# Patient Record
Sex: Female | Born: 1935 | Race: White | Hispanic: No | Smoking: Former smoker
Health system: Southern US, Community
[De-identification: ages and names within clinical notes are randomized; demographics above are authoritative.]

## PROBLEM LIST (undated history)

## (undated) DIAGNOSIS — I10 Essential (primary) hypertension: Secondary | ICD-10-CM

## (undated) DIAGNOSIS — J449 Chronic obstructive pulmonary disease, unspecified: Secondary | ICD-10-CM

## (undated) DIAGNOSIS — I350 Nonrheumatic aortic (valve) stenosis: Secondary | ICD-10-CM

## (undated) DIAGNOSIS — I472 Ventricular tachycardia, unspecified: Secondary | ICD-10-CM

## (undated) DIAGNOSIS — G459 Transient cerebral ischemic attack, unspecified: Secondary | ICD-10-CM

## (undated) DIAGNOSIS — K219 Gastro-esophageal reflux disease without esophagitis: Secondary | ICD-10-CM

## (undated) DIAGNOSIS — R42 Dizziness and giddiness: Secondary | ICD-10-CM

## (undated) DIAGNOSIS — I679 Cerebrovascular disease, unspecified: Secondary | ICD-10-CM

## (undated) DIAGNOSIS — C50919 Malignant neoplasm of unspecified site of unspecified female breast: Secondary | ICD-10-CM

## (undated) DIAGNOSIS — E785 Hyperlipidemia, unspecified: Secondary | ICD-10-CM

## (undated) DIAGNOSIS — I2510 Atherosclerotic heart disease of native coronary artery without angina pectoris: Secondary | ICD-10-CM

## (undated) DIAGNOSIS — M1990 Unspecified osteoarthritis, unspecified site: Secondary | ICD-10-CM

## (undated) DIAGNOSIS — I340 Nonrheumatic mitral (valve) insufficiency: Secondary | ICD-10-CM

## (undated) HISTORY — DX: Malignant neoplasm of unspecified site of unspecified female breast: C50.919

## (undated) HISTORY — DX: Cerebrovascular disease, unspecified: I67.9

## (undated) HISTORY — DX: Nonrheumatic aortic (valve) stenosis: I35.0

## (undated) HISTORY — PX: BREAST SURGERY: SHX581

## (undated) HISTORY — DX: Transient cerebral ischemic attack, unspecified: G45.9

## (undated) HISTORY — DX: Unspecified osteoarthritis, unspecified site: M19.90

## (undated) HISTORY — DX: Ventricular tachycardia: I47.2

## (undated) HISTORY — DX: Ventricular tachycardia, unspecified: I47.20

## (undated) HISTORY — PX: MASTECTOMY: SHX3

## (undated) HISTORY — DX: Essential (primary) hypertension: I10

## (undated) HISTORY — DX: Atherosclerotic heart disease of native coronary artery without angina pectoris: I25.10

## (undated) HISTORY — DX: Nonrheumatic mitral (valve) insufficiency: I34.0

## (undated) HISTORY — DX: Hyperlipidemia, unspecified: E78.5

## (undated) HISTORY — DX: Chronic obstructive pulmonary disease, unspecified: J44.9

## (undated) HISTORY — DX: Dizziness and giddiness: R42

## (undated) HISTORY — DX: Gastro-esophageal reflux disease without esophagitis: K21.9

## (undated) HISTORY — PX: TONSILLECTOMY: SHX5217

---

## 1968-06-02 HISTORY — PX: LOBECTOMY: SHX5089

## 1995-06-03 LAB — HM MAMMOGRAPHY

## 2004-06-02 HISTORY — PX: CARDIAC CATHETERIZATION: SHX172

## 2004-06-07 ENCOUNTER — Encounter: Payer: Self-pay | Admitting: Cardiology

## 2004-06-26 ENCOUNTER — Inpatient Hospital Stay (HOSPITAL_BASED_OUTPATIENT_CLINIC_OR_DEPARTMENT_OTHER): Admission: RE | Admit: 2004-06-26 | Discharge: 2004-06-26 | Payer: Self-pay | Admitting: Cardiovascular Disease

## 2004-06-26 ENCOUNTER — Encounter: Payer: Self-pay | Admitting: Cardiology

## 2005-09-23 ENCOUNTER — Encounter: Admission: RE | Admit: 2005-09-23 | Discharge: 2005-09-23 | Payer: Self-pay | Admitting: Internal Medicine

## 2005-09-24 ENCOUNTER — Encounter: Payer: Self-pay | Admitting: Cardiology

## 2007-06-16 ENCOUNTER — Encounter: Payer: Self-pay | Admitting: Cardiology

## 2008-06-02 HISTORY — PX: JOINT REPLACEMENT: SHX530

## 2008-07-11 ENCOUNTER — Inpatient Hospital Stay (HOSPITAL_COMMUNITY): Admission: RE | Admit: 2008-07-11 | Discharge: 2008-07-14 | Payer: Self-pay | Admitting: Orthopedic Surgery

## 2008-10-17 ENCOUNTER — Encounter: Payer: Self-pay | Admitting: Cardiology

## 2008-10-23 ENCOUNTER — Ambulatory Visit: Payer: Self-pay | Admitting: Internal Medicine

## 2008-10-23 DIAGNOSIS — I2510 Atherosclerotic heart disease of native coronary artery without angina pectoris: Secondary | ICD-10-CM | POA: Insufficient documentation

## 2008-10-23 DIAGNOSIS — R42 Dizziness and giddiness: Secondary | ICD-10-CM

## 2008-10-23 DIAGNOSIS — K219 Gastro-esophageal reflux disease without esophagitis: Secondary | ICD-10-CM

## 2008-10-23 DIAGNOSIS — E785 Hyperlipidemia, unspecified: Secondary | ICD-10-CM | POA: Insufficient documentation

## 2008-10-23 DIAGNOSIS — J4489 Other specified chronic obstructive pulmonary disease: Secondary | ICD-10-CM | POA: Insufficient documentation

## 2008-10-23 DIAGNOSIS — J449 Chronic obstructive pulmonary disease, unspecified: Secondary | ICD-10-CM | POA: Insufficient documentation

## 2008-10-23 DIAGNOSIS — Z853 Personal history of malignant neoplasm of breast: Secondary | ICD-10-CM

## 2008-10-23 DIAGNOSIS — I10 Essential (primary) hypertension: Secondary | ICD-10-CM | POA: Insufficient documentation

## 2008-11-16 ENCOUNTER — Ambulatory Visit: Payer: Self-pay | Admitting: Internal Medicine

## 2008-11-16 ENCOUNTER — Telehealth: Payer: Self-pay | Admitting: Internal Medicine

## 2008-11-16 LAB — CONVERTED CEMR LAB
ALT: 24 units/L (ref 0–35)
AST: 34 units/L (ref 0–37)
Albumin: 4.1 g/dL (ref 3.5–5.2)
Alkaline Phosphatase: 77 units/L (ref 39–117)
BUN: 20 mg/dL (ref 6–23)
Basophils Absolute: 0 10*3/uL (ref 0.0–0.1)
Basophils Relative: 0.7 % (ref 0.0–3.0)
Bilirubin, Direct: 0.2 mg/dL (ref 0.0–0.3)
CO2: 29 meq/L (ref 19–32)
Calcium: 9.7 mg/dL (ref 8.4–10.5)
Chloride: 95 meq/L — ABNORMAL LOW (ref 96–112)
Cholesterol: 168 mg/dL (ref 0–200)
Creatinine, Ser: 1 mg/dL (ref 0.4–1.2)
Eosinophils Absolute: 0.3 10*3/uL (ref 0.0–0.7)
Eosinophils Relative: 5.5 % — ABNORMAL HIGH (ref 0.0–5.0)
GFR calc non Af Amer: 56.62 mL/min (ref >60)
Glucose, Bld: 97 mg/dL (ref 70–99)
HCT: 41.6 % (ref 36.0–46.0)
HDL: 70.3 mg/dL (ref >39.00)
Hemoglobin: 14.5 g/dL (ref 12.0–15.0)
LDL Cholesterol: 87 mg/dL (ref 0–99)
Lymphocytes Relative: 29.8 % (ref 12.0–46.0)
Lymphs Abs: 1.5 10*3/uL (ref 0.7–4.0)
MCHC: 34.8 g/dL (ref 30.0–36.0)
MCV: 92.1 fL (ref 78.0–100.0)
Monocytes Absolute: 0.5 10*3/uL (ref 0.1–1.0)
Monocytes Relative: 10.2 % (ref 3.0–12.0)
Neutro Abs: 2.7 10*3/uL (ref 1.4–7.7)
Neutrophils Relative %: 53.8 % (ref 43.0–77.0)
Platelets: 179 10*3/uL (ref 150.0–400.0)
Potassium: 6 meq/L (ref 3.5–5.1)
RBC: 4.52 M/uL (ref 3.87–5.11)
RDW: 13.6 % (ref 11.5–14.6)
Sodium: 132 meq/L — ABNORMAL LOW (ref 135–145)
TSH: 1.37 microintl units/mL (ref 0.35–5.50)
Total Bilirubin: 1.2 mg/dL (ref 0.3–1.2)
Total CHOL/HDL Ratio: 2
Total Protein: 7.2 g/dL (ref 6.0–8.3)
Triglycerides: 54 mg/dL (ref 0.0–149.0)
VLDL: 10.8 mg/dL (ref 0.0–40.0)
WBC: 5 10*3/uL (ref 4.5–10.5)

## 2008-11-17 ENCOUNTER — Ambulatory Visit: Payer: Self-pay | Admitting: Internal Medicine

## 2008-11-17 ENCOUNTER — Telehealth: Payer: Self-pay | Admitting: Internal Medicine

## 2008-11-21 ENCOUNTER — Telehealth: Payer: Self-pay | Admitting: Internal Medicine

## 2008-11-21 DIAGNOSIS — G459 Transient cerebral ischemic attack, unspecified: Secondary | ICD-10-CM | POA: Insufficient documentation

## 2008-11-29 ENCOUNTER — Ambulatory Visit: Payer: Self-pay

## 2008-11-29 ENCOUNTER — Encounter: Payer: Self-pay | Admitting: Internal Medicine

## 2008-12-01 ENCOUNTER — Encounter: Admission: RE | Admit: 2008-12-01 | Discharge: 2008-12-01 | Payer: Self-pay | Admitting: Internal Medicine

## 2008-12-04 ENCOUNTER — Telehealth: Payer: Self-pay | Admitting: Internal Medicine

## 2008-12-04 DIAGNOSIS — E079 Disorder of thyroid, unspecified: Secondary | ICD-10-CM | POA: Insufficient documentation

## 2008-12-06 ENCOUNTER — Telehealth: Payer: Self-pay | Admitting: Internal Medicine

## 2008-12-06 ENCOUNTER — Ambulatory Visit: Payer: Self-pay | Admitting: Diagnostic Radiology

## 2008-12-06 ENCOUNTER — Ambulatory Visit (HOSPITAL_BASED_OUTPATIENT_CLINIC_OR_DEPARTMENT_OTHER): Admission: RE | Admit: 2008-12-06 | Discharge: 2008-12-06 | Payer: Self-pay | Admitting: Internal Medicine

## 2008-12-26 ENCOUNTER — Encounter: Payer: Self-pay | Admitting: Internal Medicine

## 2009-01-18 ENCOUNTER — Ambulatory Visit: Payer: Self-pay | Admitting: Internal Medicine

## 2009-01-22 ENCOUNTER — Telehealth: Payer: Self-pay | Admitting: Internal Medicine

## 2009-01-29 ENCOUNTER — Encounter: Payer: Self-pay | Admitting: Internal Medicine

## 2009-03-05 ENCOUNTER — Telehealth: Payer: Self-pay | Admitting: Internal Medicine

## 2009-03-14 ENCOUNTER — Ambulatory Visit: Payer: Self-pay | Admitting: Internal Medicine

## 2009-05-08 ENCOUNTER — Telehealth: Payer: Self-pay | Admitting: Internal Medicine

## 2009-07-09 ENCOUNTER — Ambulatory Visit: Payer: Self-pay | Admitting: Internal Medicine

## 2009-07-09 LAB — CONVERTED CEMR LAB
CO2: 28 meq/L (ref 19–32)
Glucose, Bld: 84 mg/dL (ref 70–99)

## 2009-07-19 ENCOUNTER — Ambulatory Visit: Payer: Self-pay | Admitting: Internal Medicine

## 2009-07-23 DIAGNOSIS — M1990 Unspecified osteoarthritis, unspecified site: Secondary | ICD-10-CM | POA: Insufficient documentation

## 2009-07-25 ENCOUNTER — Encounter: Payer: Self-pay | Admitting: Cardiology

## 2009-07-25 ENCOUNTER — Ambulatory Visit: Payer: Self-pay | Admitting: Cardiology

## 2009-07-25 DIAGNOSIS — I679 Cerebrovascular disease, unspecified: Secondary | ICD-10-CM

## 2009-07-25 DIAGNOSIS — I359 Nonrheumatic aortic valve disorder, unspecified: Secondary | ICD-10-CM | POA: Insufficient documentation

## 2009-07-29 ENCOUNTER — Emergency Department (HOSPITAL_BASED_OUTPATIENT_CLINIC_OR_DEPARTMENT_OTHER): Admission: EM | Admit: 2009-07-29 | Discharge: 2009-07-29 | Payer: Self-pay | Admitting: Emergency Medicine

## 2009-07-30 ENCOUNTER — Telehealth: Payer: Self-pay | Admitting: Cardiology

## 2009-08-01 ENCOUNTER — Ambulatory Visit: Payer: Self-pay | Admitting: Radiology

## 2009-08-01 ENCOUNTER — Ambulatory Visit (HOSPITAL_BASED_OUTPATIENT_CLINIC_OR_DEPARTMENT_OTHER): Admission: RE | Admit: 2009-08-01 | Discharge: 2009-08-01 | Payer: Self-pay | Admitting: Internal Medicine

## 2009-08-01 ENCOUNTER — Ambulatory Visit: Payer: Self-pay | Admitting: Internal Medicine

## 2009-08-09 ENCOUNTER — Telehealth (INDEPENDENT_AMBULATORY_CARE_PROVIDER_SITE_OTHER): Payer: Self-pay | Admitting: *Deleted

## 2009-08-13 ENCOUNTER — Ambulatory Visit: Payer: Self-pay

## 2009-08-13 ENCOUNTER — Ambulatory Visit: Payer: Self-pay | Admitting: Cardiology

## 2009-08-13 ENCOUNTER — Encounter (HOSPITAL_COMMUNITY): Admission: RE | Admit: 2009-08-13 | Discharge: 2009-10-02 | Payer: Self-pay | Admitting: Cardiology

## 2009-08-17 ENCOUNTER — Ambulatory Visit: Payer: Self-pay | Admitting: Internal Medicine

## 2009-08-21 ENCOUNTER — Telehealth (INDEPENDENT_AMBULATORY_CARE_PROVIDER_SITE_OTHER): Payer: Self-pay | Admitting: *Deleted

## 2009-08-30 ENCOUNTER — Telehealth: Payer: Self-pay | Admitting: Internal Medicine

## 2009-09-03 ENCOUNTER — Telehealth: Payer: Self-pay | Admitting: Internal Medicine

## 2009-11-01 ENCOUNTER — Ambulatory Visit: Payer: Self-pay | Admitting: Internal Medicine

## 2009-11-28 ENCOUNTER — Encounter: Payer: Self-pay | Admitting: Internal Medicine

## 2009-11-28 LAB — CONVERTED CEMR LAB
CO2: 24 meq/L (ref 19–32)
Calcium: 9.8 mg/dL (ref 8.4–10.5)
Creatinine, Ser: 0.96 mg/dL (ref 0.40–1.20)
LDL Cholesterol: 87 mg/dL (ref 0–99)
Potassium: 5.2 meq/L (ref 3.5–5.3)
Triglycerides: 78 mg/dL (ref <150)

## 2009-11-29 ENCOUNTER — Encounter: Payer: Self-pay | Admitting: Internal Medicine

## 2009-12-11 ENCOUNTER — Ambulatory Visit (HOSPITAL_BASED_OUTPATIENT_CLINIC_OR_DEPARTMENT_OTHER): Admission: RE | Admit: 2009-12-11 | Discharge: 2009-12-17 | Payer: Self-pay | Admitting: Surgical Oncology

## 2009-12-17 ENCOUNTER — Telehealth: Payer: Self-pay | Admitting: Internal Medicine

## 2009-12-17 ENCOUNTER — Ambulatory Visit: Payer: Self-pay | Admitting: Diagnostic Radiology

## 2010-01-11 ENCOUNTER — Telehealth: Payer: Self-pay | Admitting: Internal Medicine

## 2010-03-18 ENCOUNTER — Ambulatory Visit: Payer: Self-pay | Admitting: Internal Medicine

## 2010-04-04 ENCOUNTER — Encounter: Payer: Self-pay | Admitting: Internal Medicine

## 2010-04-04 ENCOUNTER — Ambulatory Visit: Payer: Self-pay | Admitting: Internal Medicine

## 2010-04-04 DIAGNOSIS — R0989 Other specified symptoms and signs involving the circulatory and respiratory systems: Secondary | ICD-10-CM

## 2010-04-04 DIAGNOSIS — R0609 Other forms of dyspnea: Secondary | ICD-10-CM | POA: Insufficient documentation

## 2010-04-04 DIAGNOSIS — J309 Allergic rhinitis, unspecified: Secondary | ICD-10-CM | POA: Insufficient documentation

## 2010-04-17 ENCOUNTER — Telehealth: Payer: Self-pay | Admitting: Internal Medicine

## 2010-05-10 ENCOUNTER — Ambulatory Visit: Payer: Self-pay | Admitting: Internal Medicine

## 2010-05-10 LAB — CONVERTED CEMR LAB
CO2: 26 meq/L (ref 19–32)
Glucose, Bld: 81 mg/dL (ref 70–99)
Hemoglobin: 14.4 g/dL (ref 12.0–15.0)
MCHC: 32.4 g/dL (ref 30.0–36.0)
Platelets: 191 10*3/uL (ref 150–400)
RBC: 4.49 M/uL (ref 3.87–5.11)
RDW: 14.3 % (ref 11.5–15.5)
Sodium: 138 meq/L (ref 135–145)

## 2010-05-12 ENCOUNTER — Encounter: Payer: Self-pay | Admitting: Internal Medicine

## 2010-06-24 ENCOUNTER — Encounter: Payer: Self-pay | Admitting: Internal Medicine

## 2010-06-30 LAB — CONVERTED CEMR LAB
BUN: 23 mg/dL (ref 6–23)
CO2: 29 meq/L (ref 19–32)
Calcium: 9.5 mg/dL (ref 8.4–10.5)
Chloride: 96 meq/L (ref 96–112)
Creatinine, Ser: 1 mg/dL (ref 0.4–1.2)
GFR calc non Af Amer: 56.62 mL/min (ref >60)

## 2010-07-02 NOTE — Assessment & Plan Note (Signed)
Summary: 10:15 appt., elevated B/P & not feeling well-jr   Vital Signs:  Patient profile:   77 year old female Weight:      133.25 pounds BMI:     23.69 O2 Sat:      99 % on Room air Temp:     97.3 degrees F oral Pulse rate:   74 / minute Pulse rhythm:   regular Resp:     22 per minute BP sitting:   140 / 70  (right arm) Cuff size:   regular  Vitals Entered By: Glendell Docker CMA (August 01, 2009 10:19 AM)  O2 Flow:  Room air CC: RM 3- elevated blood pressure   Primary Care Provider:  Dondra Spry DO  CC:  RM 3- elevated blood pressure.  History of Present Illness: 76 y/o white female for f/u.   We stopped HCTZ about 2 weeks ago due to relative low BP and fatigue.  her blood pressure has been elevated since then she has had an increase in dyspnea she denies chest pain  she was seen in [facility].   Bystolic dose increased  Allergies (verified): No Known Drug Allergies  Past History:  Past Medical History: OSTEOARTHRITIS (ICD-715.90) THYROID DISORDER (ICD-246.9) nodules TIA (ICD-435.9) INTERMITTENT VERTIGO (ICD-780.4) BREAST CANCER, HX OF (ICD-V10.3) COPD (ICD-496) GERD (ICD-530.81)  CORONARY ARTERY DISEASE (ICD-414.00) HYPERTENSION (ICD-401.9)  HYPERLIPIDEMIA (ICD-272.4) cardiac catheterization January/2006-single-vessel coronary artery disease with occlusion of the right current artery, mild to moderate mitral regurgitation with preserved left ventricular systolic function Dr. Elease Hashimoto ? ventricular tachycardia when she was 42   Past Surgical History: Mastectomy  1967, 1997 Total hip replacement 2010  Tonsillectomy  Lobectomy 1970      Family History: Father had cerebral hemorrhage age 73 CAD - mother        Social History: Retired  Widow/Widower son 34, 23  1 daughter 29   Tobacco Use - Former.  Alcohol Use - yes    Physical Exam  General:  alert, well-developed, and well-nourished.   Lungs:  normal respiratory effort, normal breath sounds, no  crackles, and no wheezes.   Heart:  normal rate, regular rhythm, and no gallop.   Extremities:  No lower extremity edema  Neurologic:  cranial nerves II-XII intact and gait normal.     Impression & Recommendations:  Problem # 1:  HYPERTENSION (ICD-401.9) BP higher since stopping HCTZ.  She was seen at [facility] over the weekend.   EKG and blood work reported normal.  No evidence of CHF.  I suspect dyspnea from diastolic dysfunction.   add amlodipine to lower BP.  continue higher dose of bystolic  Her updated medication list for this problem includes:    Losartan Potassium 100 Mg Tabs (Losartan potassium) .Marland Kitchen... 1 tab once a day    Bystolic 20 Mg Tabs (Nebivolol hcl) .Marland Kitchen... Take 1 tablet by mouth once a day    Amlodipine Besylate 5 Mg Tabs (Amlodipine besylate) ..... One by mouth once daily  BP today: 140/70 Prior BP: 150/70 (07/25/2009)  Labs Reviewed: K+: 4.9 (07/09/2009) Creat: : 0.97 (07/09/2009)   Chol: 168 (11/16/2008)   HDL: 70.30 (11/16/2008)   LDL: 87 (11/16/2008)   TG: 54.0 (11/16/2008)  Problem # 2:  COPD (ICD-496)  Her updated medication list for this problem includes:    Proair Hfa 108 (90 Base) Mcg/act Aers (Albuterol sulfate) .Marland Kitchen... 2 puff by mouth once daily as needed  Orders: T-2 View CXR, Same Day (71020.5TC)  Complete Medication  List: 1)  Zocor 40 Mg Tabs (Simvastatin) .... One tablet by mouth  every evening 2)  Ambien 10 Mg Tabs (Zolpidem tartrate) .... Take 1 tablet by mouth once a day as needed  as directed 3)  Omeprazole 20 Mg Tbec (Omeprazole) .... Take 1 tablet by mouth once a day as needed 4)  Proair Hfa 108 (90 Base) Mcg/act Aers (Albuterol sulfate) .... 2 puff by mouth once daily as needed 5)  Aspirin Low Dose 81 Mg Tabs (Aspirin) .... Take 1 tablet by mouth once a day 6)  Losartan Potassium 100 Mg Tabs (Losartan potassium) .Marland Kitchen.. 1 tab once a day 7)  Tramadol-acetaminophen 37.5-325 Mg Tabs (Tramadol-acetaminophen) .... Take 1 tablet by mouth once a day  as needed 8)  Ipratropium Bromide 0.06 % Soln (Ipratropium bromide) .... 2 two times a day 9)  Bystolic 20 Mg Tabs (Nebivolol hcl) .... Take 1 tablet by mouth once a day 10)  Amlodipine Besylate 5 Mg Tabs (Amlodipine besylate) .... One by mouth once daily  Patient Instructions: 1)  Please schedule a follow-up appointment in 2 weeks. Prescriptions: AMLODIPINE BESYLATE 5 MG TABS (AMLODIPINE BESYLATE) one by mouth once daily  #30 x 3   Entered and Authorized by:   D. Thomos Lemons DO   Signed by:   D. Thomos Lemons DO on 08/01/2009   Method used:   Electronically to        [facility] [phone]* (retail)       [address]       [place], [place]  [address]       Ph: [phone]       Fax: [phone]   RxID:   [id number]   Current Allergies (reviewed today): No known allergies

## 2010-07-02 NOTE — Assessment & Plan Note (Signed)
Summary: short of breath/mhf   Vital Signs:  Patient profile:   78 year old female Height:      63 inches Weight:      129.50 pounds BMI:     23.02 O2 Sat:      100 % on Room air Temp:     97.9 degrees F oral Pulse rate:   85 / minute Pulse rhythm:   regular Resp:     20 per minute BP sitting:   122 / 60  (right arm) Cuff size:   regular  Vitals Entered By: Glendell Docker CMA (April 04, 2010 11:37 AM)  O2 Flow:  Room air CC: Short of breath Is Patient Diabetic? No Pain Assessment Patient in pain? no      Comments refill of Tramdaol to [facility], increase of shortness of breath with activity for the past month   Primary Care Provider:  Dondra Spry DO  CC:  Short of breath.  History of Present Illness: 77 y/o white female with prev hx of tob use and CAD  reports more dyspnea x 1 month worse with colder temp some wheezing no significant cough  no chest pain  allergies / rhinitis also worse post nasal worse in AM  htn - stable  Preventive Screening-Counseling & Management  Alcohol-Tobacco     Smoking Status: quit  Allergies (verified): No Known Drug Allergies  Past History:  Past Medical History: OSTEOARTHRITIS (ICD-715.90) THYROID DISORDER (ICD-246.9) nodules TIA (ICD-435.9)  INTERMITTENT VERTIGO (ICD-780.4) BREAST CANCER, HX OF (ICD-V10.3)  COPD (ICD-496)  GERD (ICD-530.81)  CORONARY ARTERY DISEASE (ICD-414.00) HYPERTENSION (ICD-401.9)  HYPERLIPIDEMIA (ICD-272.4) cardiac catheterization January/2006-single-vessel coronary artery disease with occlusion of the right current artery, mild to moderate mitral regurgitation with preserved left ventricular systolic function Dr. Elease Hashimoto ? ventricular tachycardia when she was 42   Past Surgical History: Mastectomy  1967, 1997 Total hip replacement 2010  Tonsillectomy  Lobectomy 1970         Social History: Retired  Widow/Widower   son 59, 40  1 daughter 29   Tobacco Use - Former.  Alcohol  Use - yes      Review of Systems  The patient denies fever, chest pain, and severe indigestion/heartburn.    Physical Exam  General:  alert, well-developed, and well-nourished.   Head:  normocephalic and atraumatic.   Ears:  R ear normal and L ear normal.   Nose:  mucosal edema.  R > L Mouth:  pharynx pink and moist.   Neck:  No deformities, masses, or tenderness noted. Lungs:  normal respiratory effort and normal breath sounds.   Heart:  normal rate, regular rhythm, and no gallop.   Extremities:  trace left pedal edema and trace right pedal edema.   Psych:  normally interactive, good eye contact, not anxious appearing, and not depressed appearing.     Impression & Recommendations:  Problem # 1:  DYSPNEA ON EXERTION (ICD-786.09) DOE likely attributable to underlying COPD.   start maintenance inhaler  Her updated medication list for this problem includes:    Proair Hfa 108 (90 Base) Mcg/act Aers (Albuterol sulfate) .Marland Kitchen... 2 puff by mouth once daily as needed    Bystolic 20 Mg Tabs (Nebivolol hcl) .Marland Kitchen... Take 1 tablet by mouth once a day as directed    Furosemide 20 Mg Tabs (Furosemide) .Marland Kitchen... 1/2 by mouth once daily    Symbicort 160-4.5 Mcg/act Aero (Budesonide-formoterol fumarate) .Marland Kitchen... 2 puffs two times a day  Orders: T-Basic Metabolic Panel (  [phone]) T-BNP  (B Natriuretic Peptide) [phone]) T-CBC No Diff ([id number])  Problem # 2:  COPD (ICD-496) spirometry shows mild obstruction  Her updated medication list for this problem includes:    Proair Hfa 108 (90 Base) Mcg/act Aers (Albuterol sulfate) .Marland Kitchen... 2 puff by mouth once daily as needed    Symbicort 160-4.5 Mcg/act Aero (Budesonide-formoterol fumarate) .Marland Kitchen... 2 puffs two times a day  Pulmonary Functions Reviewed: O2 sat: 100 (04/04/2010)     Vaccines Reviewed: Flu Vax: Fluvax MCR (03/18/2010)  Problem # 3:  ALLERGIC RHINITIS (ICD-477.9) add intranasal steroid Her updated medication list for this problem  includes:    Ipratropium Bromide 0.06 % Soln (Ipratropium bromide) .Marland Kitchen... 2 two times a day    Fluticasone Propionate 50 Mcg/act Susp (Fluticasone propionate) .Marland Kitchen... 2 sprays each nostril once daily  Orders: T- * Misc. Laboratory test [phone])  Problem # 4:  HYPERTENSION (ICD-401.9) Assessment: Unchanged  Her updated medication list for this problem includes:    Losartan Potassium 100 Mg Tabs (Losartan potassium) .Marland Kitchen... 1 tab once a day    Bystolic 20 Mg Tabs (Nebivolol hcl) .Marland Kitchen... Take 1 tablet by mouth once a day as directed    Furosemide 20 Mg Tabs (Furosemide) .Marland Kitchen... 1/2 by mouth once daily  BP today: 122/60 Prior BP: 120/60 (11/01/2009)  Labs Reviewed: K+: 5.2 (11/28/2009) Creat: : 0.96 (11/28/2009)   Chol: 180 (11/28/2009)   HDL: 77 (11/28/2009)   LDL: 87 (11/28/2009)   TG: 78 (11/28/2009)  Complete Medication List: 1)  Zocor 40 Mg Tabs (Simvastatin) .... One tablet by mouth  every evening 2)  Ambien 10 Mg Tabs (Zolpidem tartrate) .... Take 1 tablet by mouth once a day as needed  as directed 3)  Omeprazole 20 Mg Tbec (Omeprazole) .... Take 1 tablet by mouth once a day as needed 4)  Proair Hfa 108 (90 Base) Mcg/act Aers (Albuterol sulfate) .... 2 puff by mouth once daily as needed 5)  Aspirin Low Dose 81 Mg Tabs (Aspirin) .... Take 1 tablet by mouth once a day 6)  Losartan Potassium 100 Mg Tabs (Losartan potassium) .Marland Kitchen.. 1 tab once a day 7)  Tramadol-acetaminophen 37.5-325 Mg Tabs (Tramadol-acetaminophen) .... Take 1 tablet by mouth once a day as needed 8)  Ipratropium Bromide 0.06 % Soln (Ipratropium bromide) .... 2 two times a day 9)  Bystolic 20 Mg Tabs (Nebivolol hcl) .... Take 1 tablet by mouth once a day as directed 10)  Furosemide 20 Mg Tabs (Furosemide) .... 1/2 by mouth once daily 11)  Symbicort 160-4.5 Mcg/act Aero (Budesonide-formoterol fumarate) .... 2 puffs two times a day 12)  Fluticasone Propionate 50 Mcg/act Susp (Fluticasone propionate) .... 2 sprays each nostril once  daily  Patient Instructions: 1)  Please schedule a follow-up appointment in 1 month. Prescriptions: TRAMADOL-ACETAMINOPHEN 37.5-325 MG TABS (TRAMADOL-ACETAMINOPHEN) Take 1 tablet by mouth once a day as needed  #30 x 1   Entered and Authorized by:   D. Thomos Lemons DO   Signed by:   D. Thomos Lemons DO on 04/04/2010   Method used:   Electronically to        [facility] [phone]* (retail)       [address]       [place], [place]  [address]       Ph: [phone]       Fax: [phone]   RxID:   [id number] FLUTICASONE PROPIONATE 50 MCG/ACT SUSP (FLUTICASONE PROPIONATE) 2 sprays each nostril once daily  #1 x 3  Entered and Authorized by:   D. Thomos Lemons DO   Signed by:   D. Thomos Lemons DO on 04/04/2010   Method used:   Electronically to        [facility] [phone]* (retail)       [address]       [place], [place]  [address]       Ph: [phone]       Fax: [phone]   RxID:   [id number] SYMBICORT 160-4.5 MCG/ACT AERO (BUDESONIDE-FORMOTEROL FUMARATE) 2 puffs two times a day  #1 x 3   Entered and Authorized by:   D. Thomos Lemons DO   Signed by:   D. Thomos Lemons DO on 04/04/2010   Method used:   Electronically to        [facility] [phone]* (retail)       [address]       [place], [place]  [address]       Ph: [phone]       Fax: [phone]   RxID:   [id number]    Orders Added: 1)  T-Basic Metabolic Panel [phone] 2)  T-BNP  (B Natriuretic Peptide) [[id number]] 3)  T-CBC No Diff [[address]27-10000] 4)  T- * Misc. Laboratory test [99999] 5)  Est. Patient Level IV [[address]29]    Current Allergies (reviewed today): No known allergies

## 2010-07-02 NOTE — Letter (Signed)
   [place] [facility] [address] [place], [place]  [address]  [place] Phone: [phone]      November 29, 2009   Janet Allison [address] [place], [place] [address]  RE:  LAB RESULTS  Dear  [name],  The following is an interpretation of your most recent lab tests.  Please take note of any instructions provided or changes to medications that have resulted from your lab work.  ELECTROLYTES:  Good - no changes needed  KIDNEY FUNCTION TESTS:  Good - no changes needed  LIPID PANEL:  Good - no changes needed Triglyceride: 78   Cholesterol: 180   LDL: 87   HDL: 77   Chol/HDL%:  2.3 Ratio  THYROID STUDIES:  Thyroid studies normal TSH: 1.028          Sincerely Yours,    Dr. Thomos Lemons

## 2010-07-02 NOTE — Assessment & Plan Note (Signed)
Summary: Cardiology Nuclear Study  Nuclear Med Background Indications for Stress Test: Evaluation for Ischemia   History: COPD, Heart Catheterization  History Comments: '06 Cath: single vessel CAD (RCA) with mild  to moderate MR  Symptoms: Chest Tightness, Dizziness, DOE, Fatigue, Palpitations, Rapid HR, SOB  Symptoms Comments: Last episode of CP:1 week ago.   Nuclear Pre-Procedure Cardiac Risk Factors: Family History - CAD, History of Smoking, Hypertension, Lipids, TIA Caffeine/Decaff Intake: None NPO After: 7:30 PM Lungs: Clear.  O2 Sat 98% on RA. IV 0.9% NS with Angio Cath: 22g     IV Site: (R) AC IV Started by: Irean Hong RN Chest Size (in) 36     Cup Size A     Height (in): 63 Weight (lb): 129 BMI: 22.93 Tech Comments: Held Bystolic x 24 hrs.  Nuclear Med Study 1 or 2 day study:  1 day     Stress Test Type:  Eugenie Birks Reading MD:  Willa Rough, MD     Referring MD:  Olga Millers, MD Resting Radionuclide:  Technetium 17m Tetrofosmin     Resting Radionuclide Dose:  11.0 mCi  Stress Radionuclide:  Technetium 67m Tetrofosmin     Stress Radionuclide Dose:  33.0 mCi   Stress Protocol   Lexiscan: 0.4 mg   Stress Test Technologist:  Rea College CMA-N     Nuclear Technologist:  Domenic Polite CNMT  Rest Procedure  Myocardial perfusion imaging was performed at rest 45 minutes following the intravenous administration of Myoview Technetium 25m Tetrofosmin.  Stress Procedure  The patient received IV Lexiscan 0.4 mg over 15-seconds.  Myoview injected at 30-seconds.  There were no significant changes with lexiscan.  Quantitative spect images were obtained after a 45 minute delay.  QPS Raw Data Images:  Patient motion noted; appropriate software correction applied. Stress Images:  Moderate decrease in activity at the base/mid inferior wall Rest Images:  Moderate decrease in activity at the base of the inferior wall. Subtraction (SDS):  Reversibility at the mid inferior  wall. Transient Ischemic Dilatation:  .92  (Normal <1.22)  Lung/Heart Ratio:  .11  (Normal <0.45)  Quantitative Gated Spect Images QGS EDV:  53 ml QGS ESV:  13 ml QGS EF:  75 % QGS cine images:  Good motion.  Findings Abnormal      Overall Impression  Exercise Capacity: Lexiscan BP Response: Normal blood pressure response. Clinical Symptoms: SOB ECG Impression: No significant ST segment change suggestive of ischemia. Overall Impression Comments: The tomographic images give the impression of scar at the base of the inferior wall and scar with peri-infarct  ischemia at the mid-inferior wall. The wall motion assessment seems to suggest normal motion in all areas. Some of this variation may be due to an unusual shape of the LV. Regardless, I can not rule out inferior scar with ischemia.  Appended Document: Cardiology Nuclear Study region supplied by occluded RCA; medical therapy  Appended Document: Cardiology Nuclear Study pt aware of results

## 2010-07-02 NOTE — Progress Notes (Signed)
  Faxed ROi over to [facility] to fax [phone].Marland KitchenRecieved Records back today forwarded to [facility]  August 21, 2009 4:28 PM

## 2010-07-02 NOTE — Assessment & Plan Note (Signed)
Summary: 1 month follow up/mhf   Vital Signs:  Patient profile:   77 year old female Height:      63 inches Weight:      133.25 pounds BMI:     23.69 O2 Sat:      99 % on Room air Temp:     97.4 degrees F oral Pulse rate:   73 / minute Pulse rhythm:   regular Resp:     18 per minute BP sitting:   120 / 60  (right arm) Cuff size:   regular  Vitals Entered By: Glendell Docker CMA (November 01, 2009 3:06 PM)  O2 Flow:  Room air CC: Rm 2- 1 Month Follow up  Comments taking half of Amlodipine due to dizziness, feet have been red and swelling and not sure if related to medication    Primary Care Provider:  DThomos Lemons DO  CC:  Rm 2- 1 Month Follow up .  History of Present Illness:  Hypertension Follow-Up      This is an 77 year old woman who presents for Hypertension follow-up.  The patient reports lightheadedness and edema.  The patient denies the following associated symptoms: chest pain.  Compliance with medications (by patient report) has been near 100%.  The patient reports that dietary compliance has been fair.    Allergies (verified): No Known Drug Allergies  Past History:  Past Medical History: OSTEOARTHRITIS (ICD-715.90) THYROID DISORDER (ICD-246.9) nodules TIA (ICD-435.9)  INTERMITTENT VERTIGO (ICD-780.4) BREAST CANCER, HX OF (ICD-V10.3)  COPD (ICD-496) GERD (ICD-530.81)  CORONARY ARTERY DISEASE (ICD-414.00) HYPERTENSION (ICD-401.9)  HYPERLIPIDEMIA (ICD-272.4) cardiac catheterization January/2006-single-vessel coronary artery disease with occlusion of the right current artery, mild to moderate mitral regurgitation with preserved left ventricular systolic function Dr. Elease Hashimoto ? ventricular tachycardia when she was 42   Past Surgical History: Mastectomy  1967, 1997 Total hip replacement 2010  Tonsillectomy  Lobectomy 1970        Family History: Father had cerebral hemorrhage age 32 CAD - mother           Social History: Retired  Widow/Widower  son 50,  75  1 daughter 83   Tobacco Use - Former.  Alcohol Use - yes      Physical Exam  General:  alert, well-developed, and well-nourished.   Neck:  supple and no masses.  no carotid bruits.   Lungs:  normal respiratory effort and normal breath sounds.   Heart:  normal rate, regular rhythm, and no gallop.   Extremities:  trace left pedal edema and trace right pedal edema.     Impression & Recommendations:  Problem # 1:  HYPERTENSION (ICD-401.9) Pt c/o LE edema.  DC amlodipine.   add lasix.    The following medications were removed from the medication list:    Amlodipine Besylate 5 Mg Tabs (Amlodipine besylate) ..... One half tablet by mouth once daily Her updated medication list for this problem includes:    Losartan Potassium 100 Mg Tabs (Losartan potassium) .Marland Kitchen... 1 tab once a day    Bystolic 20 Mg Tabs (Nebivolol hcl) .Marland Kitchen... Take 1 tablet by mouth once a day as directed    Furosemide 20 Mg Tabs (Furosemide) .Marland Kitchen... 1/2 by mouth once daily  BP today: 120/60 Prior BP: 118/60 (08/17/2009)  Labs Reviewed: K+: 4.9 (07/09/2009) Creat: : 0.97 (07/09/2009)   Chol: 168 (11/16/2008)   HDL: 70.30 (11/16/2008)   LDL: 87 (11/16/2008)   TG: 54.0 (11/16/2008)  Complete Medication List: 1)  Zocor 40  Mg Tabs (Simvastatin) .... One tablet by mouth  every evening 2)  Ambien 10 Mg Tabs (Zolpidem tartrate) .... Take 1 tablet by mouth once a day as needed  as directed 3)  Omeprazole 20 Mg Tbec (Omeprazole) .... Take 1 tablet by mouth once a day as needed 4)  Proair Hfa 108 (90 Base) Mcg/act Aers (Albuterol sulfate) .... 2 puff by mouth once daily as needed 5)  Aspirin Low Dose 81 Mg Tabs (Aspirin) .... Take 1 tablet by mouth once a day 6)  Losartan Potassium 100 Mg Tabs (Losartan potassium) .Marland Kitchen.. 1 tab once a day 7)  Tramadol-acetaminophen 37.5-325 Mg Tabs (Tramadol-acetaminophen) .... Take 1 tablet by mouth once a day as needed 8)  Ipratropium Bromide 0.06 % Soln (Ipratropium bromide) .... 2 two times a  day 9)  Bystolic 20 Mg Tabs (Nebivolol hcl) .... Take 1 tablet by mouth once a day as directed 10)  Furosemide 20 Mg Tabs (Furosemide) .... 1/2 by mouth once daily  Patient Instructions: 1)  Please schedule a follow-up appointment in 6 months. 2)  BMP prior to visit, ICD-9: 401.9 3)  Please return for lab work within 4 weeks. Prescriptions: FUROSEMIDE 20 MG TABS (FUROSEMIDE) 1/2 by mouth once daily  #90 x 1   Entered and Authorized by:   D. Thomos Lemons DO   Signed by:   D. Thomos Lemons DO on 11/01/2009   Method used:   Electronically to        [facility] [phone]* (retail)       [address]       [place], [place]  [address]       Ph: [phone]       Fax: [phone]   RxID:   [id number]   Current Allergies (reviewed today): No known allergies

## 2010-07-02 NOTE — Progress Notes (Signed)
Summary: Zocor & Losartan refills  Phone Note Refill Request Message from:  Fax from Pharmacy on April 17, 2010 4:47 PM  Refills Requested: Medication #1:  ZOCOR 40 MG TABS one tablet by mouth  every evening   Dosage confirmed as above?Dosage Confirmed   Brand Name Necessary? No   Supply Requested: 3 months   Last Refilled: 12/23/2009  Medication #2:  LOSARTAN POTASSIUM 100 MG TABS 1 tab once a day   Dosage confirmed as above?Dosage Confirmed   Brand Name Necessary? No   Supply Requested: 3 months   Last Refilled: 12/23/2009 [facility]   Method Requested: Electronic Next Appointment Scheduled: 05/10/10 Initial call taken by: Lannette Donath,  April 17, 2010 4:48 PM  Follow-up for Phone Call        Rx completed in Dr. Tiajuana Amass Follow-up by: Glendell Docker CMA,  April 18, 2010 9:26 AM    Prescriptions: LOSARTAN POTASSIUM 100 MG TABS (LOSARTAN POTASSIUM) 1 tab once a day  #90 x 1   Entered by:   Glendell Docker CMA   Authorized by:   D. Thomos Lemons DO   Signed by:   Glendell Docker CMA on 04/18/2010   Method used:   Electronically to        [facility]* (mail-order)       [address]       [place], [place]  [address]       Ph: [phone]       Fax: [phone]   RxID:   [id number] ZOCOR 40 MG TABS (SIMVASTATIN) one tablet by mouth  every evening  #90 x 1   Entered by:   Glendell Docker CMA   Authorized by:   D. Thomos Lemons DO   Signed by:   Glendell Docker CMA on 04/18/2010   Method used:   Electronically to        [facility]* (mail-order)       [address]       [place], [place]  [address]       Ph: [phone]       Fax: [phone]   RxID:   [id number]

## 2010-07-02 NOTE — Progress Notes (Signed)
Summary: B/P RUNNING HIGH OVER 140  Phone Note Call from Patient Call back at [facility] Phone [phone]   Caller: Patient Summary of Call: PT CALLING REGARDING HER B/P BEING HIGH OVER THE WEEKEND  IT WAS OVER 140. Initial call taken by: Judie Grieve,  July 30, 2009 11:18 AM  Follow-up for Phone Call        Called patient back and she advised me that her BP last week was 170/98, 168/90,176/101 so she went to an urgent care over the weekend and the medication Bystolic was increased to 20mg  every day. She states that she told us the incorrect dose of Bystolic when she had her office visit (she was taking 10mg  every day). She states that her BP today is 156/83..... advised her that we would let Dr.Crenshaw know and that she should keep her nuc stress test scheduled for 3/2.  Follow-up by:  J REISS RN    New/Updated Medications: BYSTOLIC 20 MG TABS (NEBIVOLOL HCL) 1 pill every day  Appended Document: B/P RUNNING HIGH OVER 140 Ask patient to monitor BP and let us know if it runs high on increased dose of bystolic  Appended Document: B/P RUNNING HIGH OVER 140 pt seeing dr Artist Pais to monitor bp

## 2010-07-02 NOTE — Progress Notes (Signed)
Summary: emergency fill for meds   Phone Note Call from Patient   Caller: patient live Call For: yoo  [place]: [place] with all her meds did not arrive with her.  She needs enough meds called in to make it to Tuesday.  [facility] [phone] [address] [place].  Lorsetan 100 MG takes 1 per day.  Simvastatin 40 MG 1 per day, Furosemide 10 mg take 1 per day.  Bistolic 20 MG take 1/2 per day  Initial call taken by: Roselle Locus,  January 11, 2010 3:46 PM  Follow-up for Phone Call        Phone Call Completed, rx sent electronically to pharmacy Follow-up by: Glendell Docker CMA,  January 11, 2010 4:18 PM    Prescriptions: ZOCOR 40 MG TABS (SIMVASTATIN) one tablet by mouth  every evening  #7 x 0   Entered by:   Glendell Docker CMA   Authorized by:   D. Thomos Lemons DO   Signed by:   Glendell Docker CMA on 01/11/2010   Method used:   Electronically to        [facility] [address]* (retail)       [address]       [place], [place]  [id number]       Ph: [phone]       Fax: [phone]   RxID:   [id number] FUROSEMIDE 20 MG TABS (FUROSEMIDE) 1/2 by mouth once daily  #7 x 0   Entered by:   Glendell Docker CMA   Authorized by:   D. Thomos Lemons DO   Signed by:   Glendell Docker CMA on 01/11/2010   Method used:   Electronically to        [facility] [address]* (retail)       [address]       [place], [place]  [id number]       Ph: [phone]       Fax: [phone]   RxID:   [id number] BYSTOLIC 20 MG TABS (NEBIVOLOL HCL) Take 1 tablet by mouth once a day as directed  #7 x 0   Entered by:   Glendell Docker CMA   Authorized by:   D. Thomos Lemons DO   Signed by:   Glendell Docker CMA on 01/11/2010   Method used:   Electronically to        [facility] [address]* (retail)       [address]       [place], [place]  [id number]       Ph: [phone]       Fax: [phone]   RxID:   [id number] LOSARTAN  POTASSIUM 100 MG TABS (LOSARTAN POTASSIUM) 1 tab once a day  #7 x 0   Entered by:   Glendell Docker CMA   Authorized by:   D. Thomos Lemons DO   Signed by:   Glendell Docker CMA on 01/11/2010   Method used:   Electronically to        [facility] [address]* (retail)       [address]       [place], [place]  [id number]       Ph: [phone]       Fax: [phone]   RxID:   [id number]

## 2010-07-02 NOTE — Progress Notes (Signed)
Summary: Nuclear Pre-Procedure  Phone Note Outgoing Call Call back at [facility] Phone [phone]   Call placed by: Stanton Kidney, EMT-P,  August 09, 2009 2:08 PM Action Taken: Phone Call Completed Summary of Call: Left message with information on Myoview Information Sheet (see scanned document for details).     Nuclear Med Background Indications for Stress Test: Evaluation for Ischemia   History: COPD, Heart Catheterization  History Comments: '06 Heart Cath: single vessel CAD (RCA)  Symptoms: DOE    Nuclear Pre-Procedure Cardiac Risk Factors: Family History - CAD, History of Smoking, Hypertension, Lipids Height (in): 63

## 2010-07-02 NOTE — Letter (Signed)
Summary: [facility] Progress Note  [facility] Progress Note   Imported By: Roderic Ovens 08/29/2009 15:12:40  _____________________________________________________________________  External Attachment:    Type:   Image     Comment:   External Document

## 2010-07-02 NOTE — Progress Notes (Signed)
Summary: [facility] Cardiology: Progress Note  [facility] Cardiology: Progress Note   Imported By: Earl Many 09/06/2009 17:39:19  _____________________________________________________________________  External Attachment:    Type:   Image     Comment:   External Document

## 2010-07-02 NOTE — Progress Notes (Signed)
Summary: Mail order refill  Phone Note Refill Request Message from:  Patient on August 30, 2009 10:00 AM  Refills Requested: Medication #1:  AMBIEN 10 MG TABS Take 1 tablet by mouth once a day as needed  as directed   Dosage confirmed as above?Dosage Confirmed   Brand Name Necessary? No   Supply Requested: 3 months  Medication #2:  AMLODIPINE BESYLATE 5 MG TABS one by mouth once daily.   Dosage confirmed as above?Dosage Confirmed   Brand Name Necessary? No   Supply Requested: 3 months  Medication #3:  BYSTOLIC 20 MG TABS one by mouth once daily   Dosage confirmed as above?Dosage Confirmed   Brand Name Necessary? No prescription solutions fax [phone]   Method Requested: Electronic Next Appointment Scheduled: 01-31-10 8 lab Initial call taken by: Roselle Locus,  August 30, 2009 10:00 AM  Follow-up for Phone Call        call pt - confirm she is only taking 10 mg of bystolic ok for mail order refill x 1 yr Follow-up by: D. Thomos Lemons DO,  August 30, 2009 11:47 AM  Additional Follow-up for Phone Call Additional follow up Details #1::        call placed to patient at [phone], no answer, voice message left to return call for medication clarification Additional Follow-up by: Glendell Docker CMA,  August 30, 2009 1:57 PM    Additional Follow-up for Phone Call Additional follow up Details #2::    patient returned phone call she states she is taking only 10 mg of the Bystolic, she cuts the pill in half, Rxs sent electronically for Amlodipine and Bystolic, and , Ambien Rx faxed to [facility] Follow-up by: Glendell Docker CMA,  August 31, 2009 1:35 PM  Prescriptions: AMBIEN 10 MG TABS (ZOLPIDEM TARTRATE) Take 1 tablet by mouth once a day as needed  as directed  #90 x 3   Entered by:   Glendell Docker CMA   Authorized by:   D. Thomos Lemons DO   Signed by:   Glendell Docker CMA on 08/31/2009   Method used:   Printed then faxed to ...       [facility]*  (mail-order)       [address]       [place], [place]  [address]       Ph: [phone]       Fax: [phone]   RxID:   [id number] AMLODIPINE BESYLATE 5 MG TABS (AMLODIPINE BESYLATE) one by mouth once daily  #90 x 3   Entered by:   Glendell Docker CMA   Authorized by:   D. Thomos Lemons DO   Signed by:   Glendell Docker CMA on 08/31/2009   Method used:   Electronically to        [facility]* (mail-order)       [address]       [place],   [address]       Ph: [phone]       Fax: [phone]   RxID:   [id number] BYSTOLIC 10 MG TABS (NEBIVOLOL HCL) one by mouth once daily  #90 x 3   Entered by:   Glendell Docker CMA   Authorized by:   D. Thomos Lemons DO   Signed by:   Glendell Docker CMA on 08/31/2009   Method used:   Electronically to        [facility]* (mail-order)  [address]       [place], [place]  [address]       Ph: [phone]       Fax: [phone]   RxID:   [id number]

## 2010-07-02 NOTE — Letter (Signed)
Summary: [facility] Echocardiography Report 2007  [facility] Echocardiography Report 2007   Imported By: Roderic Ovens 08/29/2009 15:13:47  _____________________________________________________________________  External Attachment:    Type:   Image     Comment:   External Document

## 2010-07-02 NOTE — Assessment & Plan Note (Signed)
Summary: 6 MONTH FOLLOW UP/MHF   Vital Signs:  Patient profile:   77 year old female Height:      63 inches Weight:      133 pounds BMI:     23.65 O2 Sat:      99 % on Room air Temp:     97.4 degrees F oral Pulse rate:   78 / minute Pulse rhythm:   regular Resp:     16 per minute BP sitting:   100 / 50  (right arm) Cuff size:   regular  Vitals Entered By: Glendell Docker CMA (July 19, 2009 2:16 PM)  O2 Flow:  Room air  Primary Care Provider:  D. Thomos Lemons DO  CC:  6 Month Follow up .  History of Present Illness: 6 Month Follow up  intermittent dizziness - better but still having issues when looking left.   she was seen by ENT.  [facility]hab helped but symptoms came back had hearing test -advised to have MRI by audiologist.  however she had MRI and MRA of brain in 11/2008  CAD - she requests referral to another cardiologist-had to wait over an hour -insurance coverage issue she denies chest pain, but reports dyspnea and fatigue.  her symptoms worse in AM.  no LE swelling or orthopnea  hx of thryoid nodular goiter    Preventive Screening-Counseling & Management  Alcohol-Tobacco     Smoking Status: quit  Allergies (verified): No Known Drug Allergies  Past History:  Past Medical History: Hyperlipidemia Hypertension Coronary artery disease GERD  COPD   Breast cancer, hx of   cardiac catheterization January/2006-single-vessel coronary artery disease with occlusion of the right current artery, mild to moderate mitral regurgitation with preserved left ventricular systolic function Dr. Elease Hashimoto   Past Surgical History: Mastectomy  1967, 1997 Total hip replacement 2010  Tonsillectomy  Lobectomy 1970   Family History: Father had cerebral hemorrhage age 25 CAD - mother       Social History: Retired  Widow/Widower son 29, 11  1 daughter 54    Review of Systems       see HPI    Physical Exam  General:  alert, well-developed, and well-nourished.     Head:  normocephalic and atraumatic.   Neck:  supple and no masses.  no carotid bruits.   Lungs:  normal respiratory effort and normal breath sounds.   Heart:  normal rate, regular rhythm, and no gallop.  SEM 2/6 RSB Abdomen:  soft, non-tender, and normal bowel sounds.   Extremities:  No lower extremity edema  Neurologic:  cranial nerves II-XII intact and gait normal.     Impression & Recommendations:  Problem # 1:  CORONARY ARTERY DISEASE (ICD-414.00) Pt would like to establish with new cardiologist.  she notes mild dyspnea in AM.  No chest pain.  some fatigue which I suspect is from low normal BP.  DC HCTZ.  she is having some issues with hyponatremia.  Pt advised to monitor BP at home and reports any significant change. Defer further cardiac testing to Dr. Jens Som The following medications were removed from the medication list:    Hydrochlorothiazide 25 Mg Tabs (Hydrochlorothiazide) .Marland Kitchen... Take 1 tablet by mouth every morning Her updated medication list for this problem includes:    Bystolic 5 Mg Tabs (Nebivolol hcl) .Marland Kitchen... Take 1 tablet by mouth once a day    Aspirin Low Dose 81 Mg Tabs (Aspirin) .Marland Kitchen... Take 1 tablet by mouth once a day  Losartan Potassium 100 Mg Tabs (Losartan potassium) .Marland Kitchen... 1 tab once a day  Orders: Cardiology Referral (Cardiology)  Problem # 2:  HYPERTENSION (ICD-401.9) BP low normal.  DC HCTZ The following medications were removed from the medication list:    Hydrochlorothiazide 25 Mg Tabs (Hydrochlorothiazide) .Marland Kitchen... Take 1 tablet by mouth every morning Her updated medication list for this problem includes:    Bystolic 5 Mg Tabs (Nebivolol hcl) .Marland Kitchen... Take 1 tablet by mouth once a day    Losartan Potassium 100 Mg Tabs (Losartan potassium) .Marland Kitchen... 1 tab once a day  BP today: 100/50 Prior BP: 136/66 (01/18/2009)  Labs Reviewed: K+: 4.9 (07/09/2009) Creat: : 0.97 (07/09/2009)   Chol: 168 (11/16/2008)   HDL: 70.30 (11/16/2008)   LDL: 87 (11/16/2008)   TG:  54.0 (11/16/2008)  Problem # 3:  THYROID DISORDER (ICD-246.9) Monitor yearly thyroid u/s.  TSH before next OV  IMPRESSION:   1.  Normal sized thyroid gland with diffuse heterogeneous echotexture consistent with chronic thyroiditis and/or small multinodular goiter. 2.  Findings consistent with bilateral (1 cm and less) solid and cystic probably benign thyroid adenomas.  No concerning dominant solid lesion seen. Future Orders: Radiology Referral (Radiology) ... 11/16/2009  Problem # 4:  HYPONATREMIA (ICD-276.1) mild hyponatremia.  DC hctz.  repeat BMET before next OV  Complete Medication List: 1)  Bystolic 5 Mg Tabs (Nebivolol hcl) .... Take 1 tablet by mouth once a day 2)  Zocor 40 Mg Tabs (Simvastatin) .... One tablet by mouth  every evening 3)  Ambien 10 Mg Tabs (Zolpidem tartrate) .... Take 1/2 tab by mouth at bedtime as needed 4)  Omeprazole 20 Mg Tbec (Omeprazole) .... Take 1 tablet by mouth once a day as needed 5)  Proair Hfa 108 (90 Base) Mcg/act Aers (Albuterol sulfate) .... 2 puff by mouth once daily as needed 6)  Diazepam 2 Mg Tabs (Diazepam) .... 1/2 to one tab by mouth two times a day as needed for dizziness 7)  Sodium Polystyrene Sulfonate Powd (Sodium polystyrene sulfonate) .Marland Kitchen.. 15 grams once daily 8)  Aspirin Low Dose 81 Mg Tabs (Aspirin) .... Take 1 tablet by mouth once a day 9)  Losartan Potassium 100 Mg Tabs (Losartan potassium) .Marland Kitchen.. 1 tab once a day   Patient Instructions: 1)  Please schedule a follow-up appointment in 2 months. 2)  BMP prior to visit, ICD-9:  401.9 3)  TSH prior to visit, ICD-9:  246.9 4)  Please return for lab work one (1) week before your next appointment.   Current Allergies (reviewed today): No known allergies

## 2010-07-02 NOTE — Assessment & Plan Note (Signed)
Summary: Janet Allison   Visit Type:  Initial Consult Primary Provider:  Dondra Spry DO  CC:  stablish cardiologyst - cad.  History of Present Illness: 77 year old female for establishment with history of coronary artery disease. Cardiac catheterization in 2006 revealed a 20-30% LAD and an occluded right coronary artery. There were left to right collaterals. Ejection fraction was 60% with inferior hypokinesis. She has been followed by Dr. Elease Hashimoto but would prefer to be followed here. I do not have those records available. She is treated medically. And echocardiogram in June of 2010 showed normal LV function with inferior basal hypokinesis. There was diastolic dysfunction. There was mild aortic stenosis with a mean gradient of 9 mm of mercury. There is mild to moderate mitral regurgitation and mild left atrial enlargement. Carotid Dopplers in June of 2010 showed 0-39% bilateral stenosis. Followup is recommended in 2 years. The patient does have dyspnea on exertion which is chronic. It is relieved with rest. There is no associated chest pain. There is no orthopnea, PND, pedal edema or syncope. She does occasionally have brief pains in her chest which is chronic. They are substernal and lasted one to 2 seconds. They're not related to exertion. There is no associated symptoms.  Preventive Screening-Counseling & Management  Alcohol-Tobacco     Smoking Status: quit  Current Medications (verified): 1)  Bystolic 5 Mg Tabs (Nebivolol Hcl) .... Take 1 Tablet By Mouth Once A Day 2)  Zocor 40 Mg Tabs (Simvastatin) .... One Tablet By Mouth  Every Evening 3)  Ambien 10 Mg Tabs (Zolpidem Tartrate) .... Take 1/2 Tab By Mouth At Bedtime As Needed 4)  Omeprazole 20 Mg Tbec (Omeprazole) .... Take 1 Tablet By Mouth Once A Day As Needed 5)  Proair Hfa 108 (90 Base) Mcg/act Aers (Albuterol Sulfate) .... 2 Puff By Mouth Once Daily As Needed 6)  Sodium Polystyrene Sulfonate  Powd (Sodium Polystyrene Sulfonate)  .Marland Kitchen.. 15 Grams Once Daily 7)  Aspirin Low Dose 81 Mg Tabs (Aspirin) .... Take 1 Tablet By Mouth Once A Day 8)  Losartan Potassium 100 Mg Tabs (Losartan Potassium) .Marland Kitchen.. 1 Tab Once A Day 9)  Tramadol Hcl 50 Mg Tabs (Tramadol Hcl) .... As Needed 10)  Ipratropium Bromide 0.06 % Soln (Ipratropium Bromide) .... 2 Two Times A Day  Allergies (verified): No Known Drug Allergies  Past History:  Past Medical History: OSTEOARTHRITIS (ICD-715.90) THYROID DISORDER (ICD-246.9) nodules TIA (ICD-435.9) INTERMITTENT VERTIGO (ICD-780.4) BREAST CANCER, HX OF (ICD-V10.3) COPD (ICD-496) GERD (ICD-530.81) CORONARY ARTERY DISEASE (ICD-414.00) HYPERTENSION (ICD-401.9) HYPERLIPIDEMIA (ICD-272.4) cardiac catheterization January/2006-single-vessel coronary artery disease with occlusion of the right current artery, mild to moderate mitral regurgitation with preserved left ventricular systolic function Dr. Elease Hashimoto ? ventricular tachycardia when she was 42   Past Surgical History: Reviewed history from 07/19/2009 and no changes required. Mastectomy  1967, 1997 Total hip replacement 2010  Tonsillectomy  Lobectomy 1970   Family History: Reviewed history from 07/19/2009 and no changes required. Father had cerebral hemorrhage age 60 CAD - mother       Social History: Reviewed history from 07/19/2009 and no changes required. Retired  Widow/Widower son 24, 58  1 daughter 40   Tobacco Use - Former.  Alcohol Use - yes  Review of Systems       Occasional problems with dizziness but no fevers or chills, productive cough, hemoptysis, dysphasia, odynophagia, melena, hematochezia, dysuria, hematuria, rash, seizure activity, orthopnea, PND, pedal edema, claudication. Remaining systems are negative.   Vital Signs:  Patient profile:  77 year old female Height:      63 inches Weight:      135 pounds BMI:     24.00 Pulse rate:   67 / minute Pulse rhythm:   regular Resp:     18 per minute BP sitting:   150  / 70  (right arm) Cuff size:   regular  Vitals Entered By: Vikki Ports (July 25, 2009 10:08 AM)  Physical Exam  General:  Well developed/well nourished in NAD Skin warm/dry Patient not depressed No peripheral clubbing Back-normal HEENT-normal/normal eyelids Neck supple/normal carotid upstroke bilaterally; no bruits; no JVD; no thyromegaly chest - CTA/ normal expansion CV - RRR/normal S1 and S2; no  rubs or gallops; 2/6 systolic murmur left sternal border. S2 is not diminished.  PMI nondisplaced Abdomen -NT/ND, no HSM, no mass, + bowel sounds, no bruit 2+ femoral pulses, no bruits Ext-no edema, chords, 2+ DP Neuro-grossly nonfocal     EKG  Procedure date:  07/25/2009  Findings:      Normal sinus rhythm at a rate of 67. Axis normal. No ST changes. Left atrial enlargement.  Impression & Recommendations:  Problem # 1:  CHEST PAIN (ICD-786.50) Symptoms atypical. However given dyspnea and history of coronary disease plan Myoview for risk stratification. Her updated medication list for this problem includes:    Bystolic 5 Mg Tabs (Nebivolol hcl) .Marland Kitchen... Take 1 tablet by mouth once a day    Aspirin Low Dose 81 Mg Tabs (Aspirin) .Marland Kitchen... Take 1 tablet by mouth once a day  Problem # 2:  CORONARY ARTERY DISEASE (ICD-414.00) Continue aspirin, beta blocker and statin. Will obtain all records from previous cardiologist. Her updated medication list for this problem includes:    Bystolic 5 Mg Tabs (Nebivolol hcl) .Marland Kitchen... Take 1 tablet by mouth once a day    Aspirin Low Dose 81 Mg Tabs (Aspirin) .Marland Kitchen... Take 1 tablet by mouth once a day  Orders: Nuclear Stress Test (Nuc Stress Test)  Problem # 3:  HYPERLIPIDEMIA (ICD-272.4) Continue statin. Lipids and liver monitored by primary care. Her updated medication list for this problem includes:    Zocor 40 Mg Tabs (Simvastatin) ..... One tablet by mouth  every evening  Problem # 4:  HYPERTENSION (ICD-401.9) Blood pressure mildly elevated.  However she states it typically runs in the normal range. She will follow this and we will add additional medications as needed. Her updated medication list for this problem includes:    Bystolic 5 Mg Tabs (Nebivolol hcl) .Marland Kitchen... Take 1 tablet by mouth once a day    Aspirin Low Dose 81 Mg Tabs (Aspirin) .Marland Kitchen... Take 1 tablet by mouth once a day    Losartan Potassium 100 Mg Tabs (Losartan potassium) .Marland Kitchen... 1 tab once a day  Problem # 5:  COPD (ICD-496)  Her updated medication list for this problem includes:    Proair Hfa 108 (90 Base) Mcg/act Aers (Albuterol sulfate) .Marland Kitchen... 2 puff by mouth once daily as needed  Problem # 6:  AORTIC VALVE DISORDERS (ICD-424.1) Mild aortic stenosis on previous echo. Followup echoes in the future. Her updated medication list for this problem includes:    Bystolic 5 Mg Tabs (Nebivolol hcl) .Marland Kitchen... Take 1 tablet by mouth once a day    Losartan Potassium 100 Mg Tabs (Losartan potassium) .Marland Kitchen... 1 tab once a day  Problem # 7:  CEREBROVASCULAR DISEASE (ICD-437.9) Continue aspirin and statin. Followup carotid Dopplers June of 2012.  Problem # 8:  THYROID DISORDER (ICD-246.9) Management per  primary care.  Patient Instructions: 1)  Your physician recommends that you schedule a follow-up appointment in: ONE YEAR 2)  Your physician has requested that you have an LEXISCAN stress myoview.  For further information please visit [email or web address]  Please follow instruction sheet, as given.

## 2010-07-02 NOTE — Progress Notes (Signed)
Summary: Rx order  Phone Note Call from Patient Call back at Home Phone [phone]   Caller: Patient Call For: D. Thomos Lemons DO Reason for Call: Talk to Nurse Summary of Call: Rx error. pls call Initial call taken by: Lannette Donath,  September 03, 2009 10:56 AM  Follow-up for Phone Call        call returned to patient at [phone], no answer, voice message left for patient, informing her call was being returned Follow-up by: Glendell Docker CMA,  September 03, 2009 1:52 PM  Additional Follow-up for Phone Call Additional follow up Details #1::        call returned from patient, Bystolic is costing her 120 and she would like to keep the dose at 20mg  and she  will take as instructed. medication has been re-submitted to mail order pharmacy Additional Follow-up by: Glendell Docker CMA,  September 03, 2009 4:55 PM    New/Updated Medications: BYSTOLIC 20 MG TABS (NEBIVOLOL HCL) Take 1 tablet by mouth once a day as directed Prescriptions: BYSTOLIC 20 MG TABS (NEBIVOLOL HCL) Take 1 tablet by mouth once a day as directed  #90 x 3   Entered by:   Glendell Docker CMA   Authorized by:   D. Thomos Lemons DO   Signed by:   Glendell Docker CMA on 09/03/2009   Method used:   Electronically to        [facility] (mail-order)       [address]       [place], [place]  [address]       Ph: [phone]       Fax: [phone]   RxID:   [id number]

## 2010-07-02 NOTE — Miscellaneous (Signed)
Summary: Lab Orders   Clinical Lists Changes  Orders: Added new Test order of T-Basic Metabolic Panel ([id number]) - Signed Added new Test order of T-Lipid Profile ([id number]) - Signed Added new Test order of T-TSH ([id number]) - Signed 

## 2010-07-02 NOTE — Assessment & Plan Note (Signed)
Summary: 2 week follow up/mhf   Vital Signs:  Patient profile:   77 year old female Weight:      134.50 pounds BMI:     23.91 O2 Sat:      100 % on Room air Temp:     97.5 degrees F oral Pulse rate:   75 / minute Pulse rhythm:   regular Resp:     18 per minute BP sitting:   118 / 60  (right arm) Cuff size:   regular  Vitals Entered By: Glendell Docker CMA (August 17, 2009 10:44 AM)  O2 Flow:  Room air CC: 2 Week follow up on Blood Pressure Comments discuss Bystolic dosage she is taking 10 mg vs 20 , [facility] Zolpidem , Tramadol, Amlodipine   Primary Care Provider:  Dondra Spry DO  CC:  2 Week follow up on Blood Pressure.  History of Present Illness:  Hypertension Follow-Up      This is an 77 year old woman who presents for Hypertension follow-up.  The patient denies lightheadedness and headaches.  The patient denies the following associated symptoms: chest pain.  Compliance with medications (by patient report) has been near 100%.    Allergies (verified): No Known Drug Allergies  Past History:  Past Medical History: OSTEOARTHRITIS (ICD-715.90) THYROID DISORDER (ICD-246.9) nodules TIA (ICD-435.9)  INTERMITTENT VERTIGO (ICD-780.4) BREAST CANCER, HX OF (ICD-V10.3) COPD (ICD-496) GERD (ICD-530.81)  CORONARY ARTERY DISEASE (ICD-414.00) HYPERTENSION (ICD-401.9)  HYPERLIPIDEMIA (ICD-272.4) cardiac catheterization January/2006-single-vessel coronary artery disease with occlusion of the right current artery, mild to moderate mitral regurgitation with preserved left ventricular systolic function Dr. Elease Hashimoto ? ventricular tachycardia when she was 42   Past Surgical History: Mastectomy  1967, 1997 Total hip replacement 2010  Tonsillectomy  Lobectomy 1970       Family History: Father had cerebral hemorrhage age 35 CAD - mother         Social History: Retired  Widow/Widower son 2, 8  1 daughter 58   Tobacco Use - Former.  Alcohol  Use - yes     Physical Exam  General:  alert, well-developed, and well-nourished.   Lungs:  normal respiratory effort and normal breath sounds.   Heart:  normal rate, regular rhythm, and no gallop.   Extremities:  No lower extremity edema    Impression & Recommendations:  Problem # 1:  HYPERTENSION (ICD-401.9) Assessment Improved bp improved.  she lowered bystolic dose to 10 mg.  Her updated medication list for this problem includes:    Losartan Potassium 100 Mg Tabs (Losartan potassium) .Marland Kitchen... 1 tab once a day    Bystolic 10 Mg Tabs (Nebivolol hcl) ..... One by mouth once daily    Amlodipine Besylate 5 Mg Tabs (Amlodipine besylate) ..... One by mouth once daily  BP today: 118/60 Prior BP: 140/70 (08/01/2009)  Labs Reviewed: K+: 4.9 (07/09/2009) Creat: : 0.97 (07/09/2009)   Chol: 168 (11/16/2008)   HDL: 70.30 (11/16/2008)   LDL: 87 (11/16/2008)   TG: 54.0 (11/16/2008)  Complete Medication List: 1)  Zocor 40 Mg Tabs (Simvastatin) .... One tablet by mouth  every evening 2)  Ambien 10 Mg Tabs (Zolpidem tartrate) .... Take 1 tablet by mouth once a day as needed  as directed 3)  Omeprazole 20 Mg Tbec (Omeprazole) .... Take 1 tablet by mouth once a day as needed 4)  Proair Hfa 108 (90 Base) Mcg/act Aers (Albuterol sulfate) .... 2 puff by mouth once daily as needed 5)  Aspirin Low  Dose 81 Mg Tabs (Aspirin) .... Take 1 tablet by mouth once a day 6)  Losartan Potassium 100 Mg Tabs (Losartan potassium) .Marland Kitchen.. 1 tab once a day 7)  Tramadol-acetaminophen 37.5-325 Mg Tabs (Tramadol-acetaminophen) .... Take 1 tablet by mouth once a day as needed 8)  Ipratropium Bromide 0.06 % Soln (Ipratropium bromide) .... 2 two times a day 9)  Bystolic 10 Mg Tabs (Nebivolol hcl) .... One by mouth once daily 10)  Amlodipine Besylate 5 Mg Tabs (Amlodipine besylate) .... One by mouth once daily  Patient Instructions: 1)  Please schedule a follow-up appointment in 6 months. Prescriptions: AMLODIPINE  BESYLATE 5 MG TABS (AMLODIPINE BESYLATE) one by mouth once daily  #90 x 1   Entered and Authorized by:   D. Thomos Lemons DO   Signed by:   D. Thomos Lemons DO on 08/17/2009   Method used:   Electronically to        [facility] [phone]* (retail)       [address]       [place], [place]  [address]       Ph: [phone]       Fax: [phone]   RxID:   [id number]   Current Allergies (reviewed today): No known allergies

## 2010-07-02 NOTE — Progress Notes (Signed)
Summary: Lab Results  Phone Note Outgoing Call   Summary of Call: call pt - thyroid u/s shows stable nodules Initial call taken by: D. Thomos Lemons DO,  December 17, 2009 6:23 PM  Follow-up for Phone Call        attempted to contact patient at [phone], no answer. A detailed voice message was left informing patient per Dr Artist Pais instructions. Patient advised to call back if any questions Follow-up by: Glendell Docker CMA,  December 18, 2009 9:56 AM

## 2010-07-02 NOTE — Cardiovascular Report (Signed)
Summary: [facility]: Cardiac Cath   [facility]: Cardiac Cath   Imported By: Earl Many 09/06/2009 17:45:10  _____________________________________________________________________  External Attachment:    Type:   Image     Comment:   External Document

## 2010-07-02 NOTE — Assessment & Plan Note (Signed)
Summary: flu shot/mhf  Nurse Visit   Allergies: No Known Drug Allergies  Immunizations Administered:  Influenza Vaccine # 1:    Vaccine Type: Fluvax MCR    Site: left deltoid    Mfr: GlaxoSmithK[employer]    Dose: 0.5 ml    Route: IM    Given by: Glendell Docker CMA    Exp. Date: 11/30/2010    Lot #: [id number]    VIS given: 12/25/09 version given March 18, 2010.  Flu Vaccine Consent Questions:    Do you have a history of severe allergic reactions to this vaccine? no    Any prior history of allergic reactions to egg and/or gelatin? no    Do you have a sensitivity to the preservative Thimersol? no    Do you have a past history of Guillan-Barre Syndrome? no    Do you currently have an acute febrile illness? no    Have you ever had a severe reaction to latex? no    Vaccine information given and explained to patient? yes    Are you currently pregnant? no  Orders Added: 1)  Influenza Vaccine MCR [00025] 2)  Administration Flu vaccine - MCR [G0008]

## 2010-07-04 ENCOUNTER — Encounter: Payer: Self-pay | Admitting: Internal Medicine

## 2010-07-04 ENCOUNTER — Telehealth: Payer: Self-pay | Admitting: Internal Medicine

## 2010-07-04 NOTE — Assessment & Plan Note (Signed)
Summary: 6 MONTH FOLLOW UP/MHF rsch per pt/dt   Vital Signs:  Patient profile:   77 year old female Height:      63 inches Weight:      130.25 pounds BMI:     23.16 O2 Sat:      98 % on Room air Pulse rate:   68 / minute Resp:     20 per minute BP sitting:   122 / 60  (right arm) Cuff size:   regular  Vitals Entered By: Glendell Docker CMA (May 10, 2010 10:39 AM)  O2 Flow:  Room air  Contraindications/Deferment of Procedures/Staging:    Test/Procedure: PAP Smear    Reason for deferment: patient declined  CC: 6 Month folllow , COPD follow-up Is Patient Diabetic? No Pain Assessment Patient in pain? no      Comments left ear ache for the past week intermittently     Last PAP Result Declined   Primary Care Provider:  Dondra Spry DO  CC:  6 Month folllow  and COPD follow-up.  History of Present Illness:  COPD Follow-Up      This is an 77 year old woman who presents for COPD follow-up.  The patient denies shortness of breath, cough, and increased sputum.  The patient reports limitation of strenuous activities.     Medication use includes controller med daily.    htn - stable  intermittent left ear discomfort  Preventive Screening-Counseling & Management  Alcohol-Tobacco     Smoking Status: quit  Allergies (verified): No Known Drug Allergies  Past History:  Past Medical History: OSTEOARTHRITIS (ICD-715.90) THYROID DISORDER (ICD-246.9) nodules TIA (ICD-435.9)  INTERMITTENT VERTIGO (ICD-780.4) BREAST CANCER, HX OF (ICD-V10.3)  COPD (ICD-496)   GERD (ICD-530.81)  CORONARY ARTERY DISEASE (ICD-414.00) HYPERTENSION (ICD-401.9)  HYPERLIPIDEMIA (ICD-272.4) cardiac catheterization January/2006-single-vessel coronary artery disease with occlusion of the right current artery, mild to moderate mitral regurgitation with preserved left ventricular systolic function Dr. Elease Hashimoto ? ventricular tachycardia when she was 42   Past Surgical History: Mastectomy   1967, 1997 Total hip replacement 2010  Tonsillectomy  Lobectomy 1970          Physical Exam  General:  alert, well-developed, and well-nourished.   Ears:  R ear normal and L ear normal.   Lungs:  normal respiratory effort and normal breath sounds.   Heart:  normal rate, regular rhythm, and no gallop.   Extremities:  trace left pedal edema and trace right pedal edema.     Impression & Recommendations:  Problem # 1:  COPD (ICD-496) Assessment Improved  Her updated medication list for this problem includes:    Proair Hfa 108 (90 Base) Mcg/act Aers (Albuterol sulfate) .Marland Kitchen... 2 puff by mouth once daily as needed    Symbicort 160-4.5 Mcg/act Aero (Budesonide-formoterol fumarate) .Marland Kitchen... 2 puffs two times a day  Problem # 2:  HYPERTENSION (ICD-401.9)  Her updated medication list for this problem includes:    Losartan Potassium 100 Mg Tabs (Losartan potassium) .Marland Kitchen... 1 tab once a day    Bystolic 20 Mg Tabs (Nebivolol hcl) .Marland Kitchen... Take 1 tablet by mouth once a day as directed    Furosemide 20 Mg Tabs (Furosemide) .Marland Kitchen... 1/2 by mouth once daily  Orders: T-Basic Metabolic Panel ([id number])  BP today: 122/60 Prior BP: 122/60 (04/04/2010)  Labs Reviewed: K+: 5.2 (11/28/2009) Creat: : 0.96 (11/28/2009)   Chol: 180 (11/28/2009)   HDL: 77 (11/28/2009)   LDL: 87 (11/28/2009)   TG: 78 (11/28/2009)  Complete Medication  List: 1)  Zocor 40 Mg Tabs (Simvastatin) .... One tablet by mouth  every evening 2)  Ambien 10 Mg Tabs (Zolpidem tartrate) .... Take 1 tablet by mouth once a day as needed  as directed 3)  Omeprazole 20 Mg Tbec (Omeprazole) .... Take 1 tablet by mouth once a day as needed 4)  Proair Hfa 108 (90 Base) Mcg/act Aers (Albuterol sulfate) .... 2 puff by mouth once daily as needed 5)  Aspirin Low Dose 81 Mg Tabs (Aspirin) .... Take 1 tablet by mouth once a day 6)  Losartan Potassium 100 Mg Tabs (Losartan potassium) .Marland Kitchen.. 1 tab once a day 7)  Tramadol-acetaminophen 37.5-325 Mg Tabs  (Tramadol-acetaminophen) .... Take 1 tablet by mouth once a day as needed 8)  Ipratropium Bromide 0.06 % Soln (Ipratropium bromide) .... 2 two times a day 9)  Bystolic 20 Mg Tabs (Nebivolol hcl) .... Take 1 tablet by mouth once a day as directed 10)  Furosemide 20 Mg Tabs (Furosemide) .... 1/2 by mouth once daily 11)  Symbicort 160-4.5 Mcg/act Aero (Budesonide-formoterol fumarate) .... 2 puffs two times a day 12)  Fexofenadine Hcl 180 Mg Tabs (Fexofenadine hcl) .... One by mouth once daily  Other Orders: T-CBC No Diff ([id number])  Patient Instructions: 1)  Please schedule a follow-up appointment in 4 months. Prescriptions: FUROSEMIDE 20 MG TABS (FUROSEMIDE) 1/2 by mouth once daily  #90 x 1   Entered and Authorized by:   D. Thomos Lemons DO   Signed by:   D. Thomos Lemons DO on 05/10/2010   Method used:   Electronically to        [facility] [phone]* (retail)       [address]       [place], [place]  [address]       Ph: [phone]       Fax: [phone]   RxID:   [id number] BYSTOLIC 20 MG TABS (NEBIVOLOL HCL) Take 1 tablet by mouth once a day as directed  #90 x 1   Entered and Authorized by:   D. Thomos Lemons DO   Signed by:   D. Thomos Lemons DO on 05/10/2010   Method used:   Electronically to        [facility] [phone]* (retail)       [address]       [place], [place]  [address]       Ph: [phone]       Fax: [phone]   RxID:   [id number] FEXOFENADINE HCL 180 MG TABS (FEXOFENADINE HCL) one by mouth once daily  #90 x 1   Entered and Authorized by:   D. Thomos Lemons DO   Signed by:   D. Thomos Lemons DO on 05/10/2010   Method used:   Electronically to        [facility] [phone]* (retail)       [address]       [place], [place]  [address]       Ph: [phone]       Fax: [phone]   RxID:   [id number] SYMBICORT 160-4.5 MCG/ACT AERO (BUDESONIDE-FORMOTEROL FUMARATE) 2 puffs two times a day  #1 x 5   Entered and Authorized by:   D. Thomos Lemons DO   Signed  by:   D. Thomos Lemons DO on 05/10/2010   Method used:   Print then Give to Patient   RxID:   [id number]    Orders Added:  1)  T-Basic Metabolic Panel [[id number]] 2)  T-CBC No Diff [[id number]] 3)  Est. Patient Level III [[address]]    Current Allergies (reviewed today): No known allergies      Preventive Care Screening  Pap Smear:    Date:  05/10/2010    Results:  Declined

## 2010-07-04 NOTE — Letter (Signed)
   [place] [facility] [address] [place], [place]  [address]  [place] Phone: [phone]      May 12, 2010   Janet Allison [address] [place], [place] [address]  RE:  LAB RESULTS  Dear  Ms. Sirek,  The following is an interpretation of your most recent lab tests.  Please take note of any instructions provided or changes to medications that have resulted from your lab work.  ELECTROLYTES:  Good - no changes needed  KIDNEY FUNCTION TESTS:  Good - no changes needed    CBC:  Good - no changes needed       Sincerely Yours,    Dr. Thomos Lemons  Appended Document:  mailed

## 2010-07-10 NOTE — Progress Notes (Signed)
Summary: Refill on nasal spray  Phone Note Call from Patient Call back at Home Phone [phone] Call back at [phone]   Caller: Patient Call For: D. Thomos Lemons DO Reason for Call: Refill Medication Summary of Call: patient called and left voice message stating she was advised by the pharmacist to contact our office regarding a refill on Ipratropium bromide nasal spray. She states her nose continues to drip away. Initial call taken by: Glendell Docker CMA,  July 04, 2010 10:03 AM  Follow-up for Phone Call        ok for refill x 2 Follow-up by: D. Thomos Lemons DO,  July 04, 2010 3:02 PM  Additional Follow-up for Phone Call Additional follow up Details #1::        Refill sent to pharmacy. Pt notified. Nicki Guadalajara Fergerson CMA Duncan Dull)  July 04, 2010 3:38 PM     Prescriptions: IPRATROPIUM BROMIDE 0.06 % SOLN (IPRATROPIUM BROMIDE) 2 two times a day  #15 [place] x 1   Entered by:   Mervin Kung CMA (AAMA)   Authorized by:   D. Thomos Lemons DO   Signed by:   Mervin Kung CMA (AAMA) on 07/04/2010   Method used:   Electronically to        [facility] [phone]* (retail)       [address]       [place], [place]  [address]       Ph: [phone]       Fax: [phone]   RxID:   [id number]

## 2010-07-17 ENCOUNTER — Telehealth: Payer: Self-pay | Admitting: Internal Medicine

## 2010-07-24 NOTE — Progress Notes (Signed)
Summary: Medication Refills: ipratropium, furosemide and ambien  Phone Note Call from Patient Call back at [facility] [phone]   Caller: Patient Call For: D. Thomos Lemons DO Summary of Call: patient called and left voice message requesting a return call this afternoon, she is stating she will need [facility]ills on [facility] Initial call taken by: Glendell Docker CMA,  July 17, 2010 10:48 AM  Follow-up for Phone Call        Left message on machine to return my call. Nicki Guadalajara Fergerson CMA Duncan Dull)  July 17, 2010 4:16 PM   Additional Follow-up for Phone Call Additional follow up Details #1::        Rxs called to [facility] at [facility]. Pt notified. Additional Follow-up by: Mervin Kung CMA Duncan Dull),  July 17, 2010 5:04 PM    Prescriptions: FUROSEMIDE 20 MG TABS (FUROSEMIDE) 1/2 by mouth once daily  #90 x 1   Entered by:   Mervin Kung CMA (AAMA)   Authorized by:   D. Thomos Lemons DO   Signed by:   Mervin Kung CMA (AAMA) on 07/17/2010   Method used:   Telephoned to ...       [facility]* (mail-order)       [address]       [place], [place]  [address]       Ph: [phone]       Fax: [phone]   RxID:   [id number] AMBIEN 10 MG TABS (ZOLPIDEM TARTRATE) Take 1 tablet by mouth once a day as needed  as directed  #90 x 1   Entered by:   Mervin Kung CMA (AAMA)   Authorized by:   D. Thomos Lemons DO   Signed by:   Mervin Kung CMA (AAMA) on 07/17/2010   Method used:   Telephoned to ...       [facility]* (mail-order)       [address] [place], [place]  [address]       Ph: [phone]       Fax: [phone]   RxID:   [id number] IPRATROPIUM BROMIDE 0.06 % SOLN (IPRATROPIUM BROMIDE) 2 two times a day  #15 [place] x 1   Entered by:   Mervin Kung CMA (AAMA)   Authorized by:   D. Thomos Lemons DO   Signed by:   Mervin Kung CMA (AAMA) on 07/17/2010   Method used:    Telephoned to ...       [facility]* (mail-order)       [address]       [place], [place]  [address]       Ph: [phone]       Fax: [phone]   RxID:   [id number]

## 2010-08-05 ENCOUNTER — Encounter: Payer: Self-pay | Admitting: Internal Medicine

## 2010-08-05 ENCOUNTER — Ambulatory Visit (HOSPITAL_BASED_OUTPATIENT_CLINIC_OR_DEPARTMENT_OTHER)
Admission: RE | Admit: 2010-08-05 | Discharge: 2010-08-05 | Disposition: A | Discharge status: Home / Self Care | Payer: Medicare Other | Source: Ambulatory Visit | Attending: Internal Medicine | Admitting: Internal Medicine

## 2010-08-05 ENCOUNTER — Other Ambulatory Visit: Payer: Self-pay | Admitting: Internal Medicine

## 2010-08-05 ENCOUNTER — Ambulatory Visit (INDEPENDENT_AMBULATORY_CARE_PROVIDER_SITE_OTHER): Payer: Medicare Other | Admitting: Internal Medicine

## 2010-08-05 ENCOUNTER — Telehealth: Payer: Self-pay | Admitting: Internal Medicine

## 2010-08-05 DIAGNOSIS — M25519 Pain in unspecified shoulder: Secondary | ICD-10-CM

## 2010-08-13 NOTE — Progress Notes (Signed)
Summary: Tramdaol Mail Oorder  Phone Note Call from Patient Call back at Home Phone [phone] P PH     Caller: Patient Call For: D. Thomos Lemons DO Summary of Call: patient called and left voice message requesting a 90 day supply for Tramadol to [facility] Initial call taken by: Glendell Docker CMA,  August 05, 2010 4:53 PM  Follow-up for Phone Call        I would avoid using long term.  If persistent symptoms after pt seen by ortho, we can discuss treatment options  If cost is issue, I suggest with switch to just tramadol and she can take 325 mg of tylenol at the same time Follow-up by: D. Thomos Lemons DO,  August 05, 2010 5:59 PM  Additional Follow-up for Phone Call Additional follow up Details #1::        call was placed to patient at [phone], she was informed per Dr Artist Pais instructions. She stated she will follow up with Dr Artist Pais next month regarding the Tramadol, she has enough for now. Additional Follow-up by: Glendell Docker CMA,  August 06, 2010 9:29 AM

## 2010-08-21 LAB — BASIC METABOLIC PANEL
BUN: 17 mg/dL (ref 6–23)
CO2: 29 mEq/L (ref 19–32)
Chloride: 97 mEq/L (ref 96–112)
Creatinine, Ser: 1 mg/dL (ref 0.4–1.2)
Glucose, Bld: 98 mg/dL (ref 70–99)
Potassium: 4.5 mEq/L (ref 3.5–5.1)

## 2010-08-21 LAB — DIFFERENTIAL
Basophils Relative: 0 % (ref 0–1)
Eosinophils Absolute: 0 10*3/uL (ref 0.0–0.7)
Eosinophils Relative: 1 % (ref 0–5)
Lymphs Abs: 1.2 10*3/uL (ref 0.7–4.0)

## 2010-08-21 LAB — CBC
HCT: 42.2 % (ref 36.0–46.0)
MCHC: 34 g/dL (ref 30.0–36.0)
MCV: 96.7 fL (ref 78.0–100.0)
Platelets: 178 10*3/uL (ref 150–400)
RDW: 13.9 % (ref 11.5–15.5)
WBC: 5.9 10*3/uL (ref 4.0–10.5)

## 2010-08-29 NOTE — Assessment & Plan Note (Signed)
Summary: right shoulder hurting/ss   Vital Signs:  Patient profile:   77 year old female Height:      63 inches Weight:      130.25 pounds BMI:     23.16 O2 Sat:      97 % on Room air Temp:     97.3 degrees F oral Resp:     18 per minute BP sitting:   110 / 80  (left arm) Cuff size:   regular  Vitals Entered By: Glendell Docker CMA (August 05, 2010 11:25 AM)  O2 Flow:  Room air CC: Right shoulder pain Is Patient Diabetic? No Pain Assessment Patient in pain? yes     Location: shoulder Type: aching Onset of pain  Constant Comments c/o right shoulder pain for the past month varying in instensity, taken Tramadol with relief, she is requesting a refill on Tramadol, for local and  mail order   Primary Care Provider:  D. Thomos Lemons DO  CC:  Right shoulder pain.  History of Present Illness: 24 c/o chronic right shoulder woke up pain 1 month ago hurts to lay on right side,  worse over the weekend she describes as aches.  pain occ gets worse no change with motion  same side as radical mastectomy   Preventive Screening-Counseling & Management  Alcohol-Tobacco     Smoking Status: quit  Allergies (verified): No Known Drug Allergies  Past History:  Past Medical History: OSTEOARTHRITIS (ICD-715.90) THYROID DISORDER (ICD-246.9) nodules TIA (ICD-435.9)  INTERMITTENT VERTIGO (ICD-780.4) BREAST CANCER, HX OF (ICD-V10.3)   COPD (ICD-496)   GERD (ICD-530.81)  CORONARY ARTERY DISEASE (ICD-414.00) HYPERTENSION (ICD-401.9)  HYPERLIPIDEMIA (ICD-272.4) cardiac catheterization January/2006-single-vessel coronary artery disease with occlusion of the right current artery, mild to moderate mitral regurgitation with preserved left ventricular systolic function Dr. Elease Hashimoto ? ventricular tachycardia when she was 20   Family History: Father had cerebral hemorrhage age 84 CAD - mother            Social History: Retired  Widow/Widower    son 66, 39  1 daughter 70   Tobacco Use -  Former.  Alcohol Use - yes      Physical Exam  General:  alert, well-developed, and well-nourished.   Lungs:  normal respiratory effort and normal breath sounds.   Heart:  normal rate, regular rhythm, and no gallop.   Msk:  right shouder - normal ROM,  atrophy of posterior deltoid.  mild winged scapula mild tenderess lateral deltoid   Impression & Recommendations:  Problem # 1:  SHOULDER PAIN, RIGHT (ICD-719.41) chronic right shoulder pain.  atrophy of posterior deltoid.  question nerve injury during prev mastectomy rule out bony metz.  refer to ortho for further eval  Her updated medication list for this problem includes:    Aspirin Low Dose 81 Mg Tabs (Aspirin) .Marland Kitchen... Take 1 tablet by mouth once a day    Tramadol-acetaminophen 37.5-325 Mg Tabs (Tramadol-acetaminophen) .Marland Kitchen... Take 1 tablet by mouth once a day as needed  Orders: T-Shoulder Right (73030TC) Orthopedic Referral (Ortho)  Complete Medication List: 1)  Zocor 40 Mg Tabs (Simvastatin) .... One tablet by mouth  every evening 2)  Ambien 10 Mg Tabs (Zolpidem tartrate) .... Take 1 tablet by mouth once a day as needed  as directed 3)  Proair Hfa 108 (90 Base) Mcg/act Aers (Albuterol sulfate) .... 2 puff by mouth once daily as needed 4)  Aspirin Low Dose 81 Mg Tabs (Aspirin) .... Take 1 tablet by mouth once a  day 5)  Losartan Potassium 100 Mg Tabs (Losartan potassium) .Marland Kitchen.. 1 tab once a day 6)  Tramadol-acetaminophen 37.5-325 Mg Tabs (Tramadol-acetaminophen) .... Take 1 tablet by mouth once a day as needed 7)  Ipratropium Bromide 0.06 % Soln (Ipratropium bromide) .... 2 two times a day 8)  Bystolic 20 Mg Tabs (Nebivolol hcl) .... Take 1 tablet by mouth once a day as directed 9)  Furosemide 20 Mg Tabs (Furosemide) .... 1/2 by mouth once daily 10)  Symbicort 160-4.5 Mcg/act Aero (Budesonide-formoterol fumarate) .... 2 puffs two times a day 11)  Vitamin B-12 100 Mcg Tabs (Cyanocobalamin) .... Take 1 tablet by mouth once a  day  Patient Instructions: 1)  Keep your next follow up appointment Prescriptions: TRAMADOL-ACETAMINOPHEN 37.5-325 MG TABS (TRAMADOL-ACETAMINOPHEN) Take 1 tablet by mouth once a day as needed  #30 x 0   Entered by:   Glendell Docker CMA   Authorized by:   D. Thomos Lemons DO   Signed by:   Glendell Docker CMA on 08/05/2010   Method used:   Electronically to        [id number]* (retail)       [address]       [place], [place]  [address]       Ph: [phone]       Fax: [phone]   RxID:   [id number] TRAMADOL-ACETAMINOPHEN 37.5-325 MG TABS (TRAMADOL-ACETAMINOPHEN) Take 1 tablet by mouth once a day as needed  #30 x 0   Entered and Authorized by:   D. Thomos Lemons DO   Signed by:   D. Thomos Lemons DO on 08/05/2010   Method used:   Electronically to        [facility] [phone]* (retail)       [address]       [place], [place]  [address]       Ph: [phone]       Fax: [phone]   RxID:   [id number]    Orders Added: 1)  T-Shoulder Right [73030TC] 2)  Orthopedic Referral [Ortho] 3)  Est. Patient Level III [[address]]    Current Allergies (reviewed today): No known allergies

## 2010-09-04 ENCOUNTER — Ambulatory Visit (INDEPENDENT_AMBULATORY_CARE_PROVIDER_SITE_OTHER): Payer: Medicare Other | Admitting: Internal Medicine

## 2010-09-04 ENCOUNTER — Encounter: Payer: Self-pay | Admitting: Internal Medicine

## 2010-09-04 VITALS — BP 110/60 | HR 73 | Temp 97.5°F | Resp 16 | Ht 63.0 in | Wt 128.0 lb

## 2010-09-04 DIAGNOSIS — E785 Hyperlipidemia, unspecified: Secondary | ICD-10-CM

## 2010-09-04 DIAGNOSIS — F5104 Psychophysiologic insomnia: Secondary | ICD-10-CM

## 2010-09-04 DIAGNOSIS — R269 Unspecified abnormalities of gait and mobility: Secondary | ICD-10-CM

## 2010-09-04 DIAGNOSIS — G4700 Insomnia, unspecified: Secondary | ICD-10-CM

## 2010-09-04 DIAGNOSIS — R413 Other amnesia: Secondary | ICD-10-CM

## 2010-09-04 DIAGNOSIS — R2681 Unsteadiness on feet: Secondary | ICD-10-CM

## 2010-09-04 LAB — TSH: TSH: 0.924 u[IU]/mL (ref 0.350–4.500)

## 2010-09-04 MED ORDER — ZOLPIDEM TARTRATE 5 MG PO TABS: 2.5000 mg | ORAL_TABLET | Freq: Every evening | ORAL | Status: DC | PRN

## 2010-09-04 MED ORDER — RAMELTEON 8 MG PO TABS: ORAL_TABLET | ORAL | Status: DC

## 2010-09-04 NOTE — Progress Notes (Signed)
Subjective:    Patient ID: Janet Allison, female    DOB: [birth year], 77 y.o.   MRN: [id number]  HPI  77 y/o female for follow  Shoulder pain better with PT.  Still has pain right shoulder.  Seen by Dr. Jillyn Hidden.  She received cortisone injection and prednisone. Also using muscle relaxer.  Pt c/o memory problems.  She thinks symptoms may be related ambien use.  She has been taking regularly since 2008  Daughter has also noticed short term memory.    Review of Systems  Past Medical History  Diagnosis Date  . Allergy     osteoarthritis  . Cancer     breast  . COPD (chronic obstructive pulmonary disease)   . GERD (gastroesophageal reflux disease)   . Hypertension   . Thyroid nodule   . TIA (transient ischemic attack)   . Vertigo, intermittent   . CAD (coronary artery disease)   . Hyperlipidemia     History   Social History  . Marital Status: Widowed    Spouse Name: N/A    Number of Children: N/A  . Years of Education: N/A   Occupational History  . retired    Social History Main Topics  . Smoking status: Former Games developer  . Smokeless tobacco: Not on file  . Alcohol Use: Yes  . Drug Use:   . Sexually Active:    Other Topics Concern  . Not on file   Social History Narrative  . No narrative on file    Past Surgical History  Procedure Date  . Cardiac catheterization 06/2004    single-vessel coronary artery disease with occlusion of the right current artery, mild to moderate mitral regurgitation with preserved left ventricular systolic function Dr Elease Hashimoto .  ? Ventricular tachycardia at 42.  . Mastectomy 1967 and 1997  . Joint replacement 2010    total hip  . Tonsillectomy   . Lobectomy 1970    Family History  Problem Relation Age of Onset  . Heart disease Mother     CAD    No Known Allergies  Current Outpatient Prescriptions on File Prior to Visit  Medication Sig Dispense Refill  . albuterol (PROAIR HFA) 108 (90 BASE) MCG/ACT inhaler Inhale 2 puffs into  the lungs daily.        Marland Kitchen aspirin 81 MG tablet Take 81 mg by mouth daily.        . budesonide-formoterol (SYMBICORT) 160-4.5 MCG/ACT inhaler Inhale 2 puffs into the lungs 2 (two) times daily.        . furosemide (LASIX) 20 MG tablet Take 10 mg by mouth.       Marland Kitchen ipratropium (ATROVENT) 0.06 % nasal spray 2 sprays by Nasal route 2 (two) times daily.        Marland Kitchen losartan (COZAAR) 100 MG tablet Take 100 mg by mouth daily.        . Nebivolol HCl (BYSTOLIC) 20 MG TABS Take 1 tablet by mouth daily.        Marland Kitchen tramadol-acetaminophen (ULTRACET) 37.5-325 MG per tablet Take 1 tablet by mouth daily as needed.        . fexofenadine (ALLEGRA) 180 MG tablet Take 180 mg by mouth daily.        Marland Kitchen omeprazole (PRILOSEC) 20 MG capsule Take 20 mg by mouth daily as needed.          BP 110/60  Pulse 73  Temp(Src) 97.5 F (36.4 C) (Oral)  Resp 16  Ht 5\' 3"  (1.6  m)  Wt 128 lb (58.06 kg)  BMI 22.67 kg/m2  SpO2 99%    Objective:   Physical Exam  Constitutional: She is oriented to person, place, and time. She appears well-developed and well-nourished. No distress.  HENT:  Head: Normocephalic and atraumatic.  Right Ear: External ear normal.  Left Ear: External ear normal.  Mouth/Throat: Oropharynx is clear and moist.  Eyes: Conjunctivae are normal. Pupils are equal, round, and reactive to light.  Neck: Normal range of motion. Neck supple.  Cardiovascular: Normal rate, regular rhythm and normal heart sounds.   Pulmonary/Chest: Effort normal and breath sounds normal. She has no wheezes. She has no rales.  Neurological: She is alert and oriented to person, place, and time. No cranial nerve deficit. Coordination normal.  Skin: Skin is dry.  Psychiatric: She has a normal mood and affect. Her behavior is normal. Judgment and thought content normal.          Assessment & Plan:

## 2010-09-04 NOTE — Progress Notes (Signed)
Spoke to [place] at [employer] and gave verbal per previous documentation.

## 2010-09-05 ENCOUNTER — Telehealth: Payer: Self-pay | Admitting: Internal Medicine

## 2010-09-05 DIAGNOSIS — G4700 Insomnia, unspecified: Secondary | ICD-10-CM

## 2010-09-05 MED ORDER — RAMELTEON 8 MG PO TABS: ORAL_TABLET | ORAL | Status: DC

## 2010-09-05 NOTE — Telephone Encounter (Signed)
Patient states since she is only taking the Rozerm for one month she would like it sent to the local pharmacy.

## 2010-09-05 NOTE — Telephone Encounter (Signed)
Call placed to patient at [phone], she was informed per Dr Artist Pais instructions

## 2010-09-05 NOTE — Telephone Encounter (Signed)
Pt states that she has called [facility]([address]), and pharmacist told pt the rx for ramelteon has not been called in. Pt request rx for ramelteon to be called in.

## 2010-09-05 NOTE — Telephone Encounter (Signed)
Left message on machine to return my call. Rx was called in to [facility], does pt also need 30 day supply to local pharmacy?

## 2010-09-05 NOTE — Telephone Encounter (Signed)
Call pt - b12 and thyroid function normal

## 2010-09-07 ENCOUNTER — Encounter: Payer: Self-pay | Admitting: Internal Medicine

## 2010-09-07 DIAGNOSIS — R2681 Unsteadiness on feet: Secondary | ICD-10-CM | POA: Insufficient documentation

## 2010-09-07 DIAGNOSIS — F5104 Psychophysiologic insomnia: Secondary | ICD-10-CM | POA: Insufficient documentation

## 2010-09-07 NOTE — Assessment & Plan Note (Signed)
Pt reports intermittent gail instability Neuro exam normal Question side effect from zolpidem Taper off

## 2010-09-07 NOTE — Assessment & Plan Note (Signed)
Taper off zolpidem Trial of rozerem Also decrease etoh use

## 2010-09-07 NOTE — Assessment & Plan Note (Signed)
Simvastatin may be contributing to memory loss Hold x 1 month

## 2010-09-07 NOTE — Assessment & Plan Note (Addendum)
Pt reports short term memory problems.   Her focus and calculation seems appropriate Question side effect from chronic ambien use Also consider side effect from simvastatin  Hold both meds Reassess in 1 month Perform MMSE at next OV Check TSH, and B12 level

## 2010-09-09 ENCOUNTER — Ambulatory Visit: Payer: Self-pay | Admitting: Internal Medicine

## 2010-09-16 ENCOUNTER — Telehealth: Payer: Self-pay | Admitting: *Deleted

## 2010-09-16 NOTE — Telephone Encounter (Signed)
Patient called and left voice message requesting a return call regarding her medications. She stated that she has $313 bill that she will need to take care of , and she has questions about her medications.

## 2010-09-17 LAB — APTT: aPTT: 31 seconds (ref 24–37)

## 2010-09-17 LAB — BASIC METABOLIC PANEL
CO2: 26 mEq/L (ref 19–32)
CO2: 27 mEq/L (ref 19–32)
Calcium: 8 mg/dL — ABNORMAL LOW (ref 8.4–10.5)
Calcium: 8.4 mg/dL (ref 8.4–10.5)
Calcium: 9.9 mg/dL (ref 8.4–10.5)
Chloride: 94 mEq/L — ABNORMAL LOW (ref 96–112)
Creatinine, Ser: 0.99 mg/dL (ref 0.4–1.2)
Creatinine, Ser: 1.13 mg/dL (ref 0.4–1.2)
GFR calc Af Amer: 56 mL/min — ABNORMAL LOW (ref >60)
GFR calc Af Amer: >60 mL/min (ref >60)
GFR calc Af Amer: >60 mL/min (ref >60)
GFR calc non Af Amer: 54 mL/min — ABNORMAL LOW (ref >60)
GFR calc non Af Amer: >60 mL/min (ref >60)
Potassium: 4.3 mEq/L (ref 3.5–5.1)
Sodium: 128 mEq/L — ABNORMAL LOW (ref 135–145)
Sodium: 131 mEq/L — ABNORMAL LOW (ref 135–145)

## 2010-09-17 LAB — CBC
Hemoglobin: 14.1 g/dL (ref 12.0–15.0)
Hemoglobin: 9.8 g/dL — ABNORMAL LOW (ref 12.0–15.0)
MCHC: 33.8 g/dL (ref 30.0–36.0)
MCHC: 34.2 g/dL (ref 30.0–36.0)
RBC: 2.99 MIL/uL — ABNORMAL LOW (ref 3.87–5.11)
RBC: 3.18 MIL/uL — ABNORMAL LOW (ref 3.87–5.11)
RBC: 4.31 MIL/uL (ref 3.87–5.11)
WBC: 5.9 10*3/uL (ref 4.0–10.5)

## 2010-09-17 LAB — URINALYSIS, ROUTINE W REFLEX MICROSCOPIC
Bilirubin Urine: NEGATIVE
Nitrite: NEGATIVE
Specific Gravity, Urine: 1.012 (ref 1.005–1.030)
Urobilinogen, UA: 0.2 mg/dL (ref 0.0–1.0)

## 2010-09-17 LAB — PROTIME-INR: INR: 1.1 (ref 0.00–1.49)

## 2010-09-17 LAB — ABO/RH: ABO/RH(D): AB POS

## 2010-09-17 LAB — DIFFERENTIAL
Lymphs Abs: 1.2 10*3/uL (ref 0.7–4.0)
Monocytes Absolute: 0.6 10*3/uL (ref 0.1–1.0)
Monocytes Relative: 10 % (ref 3–12)
Neutro Abs: 4.1 10*3/uL (ref 1.7–7.7)
Neutrophils Relative %: 69 % (ref 43–77)

## 2010-09-17 LAB — TYPE AND SCREEN
ABO/RH(D): AB POS
Antibody Screen: NEGATIVE

## 2010-09-17 NOTE — Telephone Encounter (Signed)
See if mail order co can take medication back and provide refund Rx to mail order was sent in error

## 2010-09-17 NOTE — Telephone Encounter (Signed)
Call placed to patient at [phone], she states Dr Artist Pais started her on a new medication, which was to be sent to local pharmacy which she has received. She states she has received another Rx from mail order for the same medication. She states that she received a bill for $233 for the new medication and she paid additional $80 for the local Rx. She would like to know the where the communication fell through, because she cannot afford to pay additional 233 for the mail order rx.

## 2010-09-18 NOTE — Telephone Encounter (Signed)
Call placed to [facility] at [phone],spoke with pharmacist Janet Allison stated the medication for the patient has already been shipped and they could not refund the patient. He was advised to cancel remaining refills for Rozerm. He verbalized understanding and remaining refills for Rozerm have been cancelled.   Call placed to patient at [phone], Patient states that  Her concern is that she does not want to be billed on her Visa for this medication, and at that price she paid at the local pharmacy, she was not intending to continue with the medication. She has requested to speak with the office manager, to see how this could be taken care of. She was provided with Janet Allison  Name and office number for [facility])

## 2010-10-04 ENCOUNTER — Ambulatory Visit: Payer: Medicare Other | Admitting: Internal Medicine

## 2010-10-08 ENCOUNTER — Encounter: Payer: Self-pay | Admitting: Cardiology

## 2010-10-09 ENCOUNTER — Encounter: Payer: Self-pay | Admitting: Cardiology

## 2010-10-09 ENCOUNTER — Ambulatory Visit (INDEPENDENT_AMBULATORY_CARE_PROVIDER_SITE_OTHER): Payer: Medicare Other | Admitting: Cardiology

## 2010-10-09 DIAGNOSIS — E785 Hyperlipidemia, unspecified: Secondary | ICD-10-CM

## 2010-10-09 DIAGNOSIS — I359 Nonrheumatic aortic valve disorder, unspecified: Secondary | ICD-10-CM

## 2010-10-09 DIAGNOSIS — I6529 Occlusion and stenosis of unspecified carotid artery: Secondary | ICD-10-CM

## 2010-10-09 DIAGNOSIS — I679 Cerebrovascular disease, unspecified: Secondary | ICD-10-CM

## 2010-10-09 DIAGNOSIS — I10 Essential (primary) hypertension: Secondary | ICD-10-CM

## 2010-10-09 DIAGNOSIS — I2510 Atherosclerotic heart disease of native coronary artery without angina pectoris: Secondary | ICD-10-CM

## 2010-10-09 NOTE — Assessment & Plan Note (Signed)
Continue statin. Lipids and liver monitored by primary care. 

## 2010-10-09 NOTE — Assessment & Plan Note (Signed)
History of mild aortic stenosis and mild to moderate mitral regurgitation. Repeat echocardiogram.

## 2010-10-09 NOTE — Progress Notes (Signed)
HPI: Pleasant female for FU of coronary artery disease. Cardiac catheterization in 2006 revealed a 20-30% LAD and an occluded right coronary artery. There were left to right collaterals. Ejection fraction was 60% with inferior hypokinesis. She is treated medically. And echocardiogram in June of 2010 showed normal LV function with inferior basal hypokinesis. There was diastolic dysfunction. There was mild aortic stenosis with a mean gradient of 9 mm of mercury. There is mild to moderate mitral regurgitation and mild left atrial enlargement. Carotid Dopplers in June of 2010 showed 0-39% bilateral stenosis. Followup is recommended in 2 years. Myoview in March of 2011 revealed EF 75% and inferior scar/ischemia could not be excluded. Medical therapy as area supplied by occluded RCA. Since I last saw her in Feb of 2011, the patient denies any dyspnea on exertion, orthopnea, PND, pedal edema, palpitations, syncope or chest pain. Occasional presyncopal episodes that had been present for years. No associated palpitations.   Current Outpatient Prescriptions  Medication Sig Dispense Refill  . albuterol (PROAIR HFA) 108 (90 BASE) MCG/ACT inhaler Inhale 2 puffs into the lungs daily.        Marland Kitchen aspirin 81 MG tablet Take 81 mg by mouth daily.        . B Complex Vitamins (B COMPLEX 1 PO) Take by mouth.        . budesonide-formoterol (SYMBICORT) 160-4.5 MCG/ACT inhaler Inhale 2 puffs into the lungs 2 (two) times daily.        . Cholecalciferol (VITAMIN D3) 10000 UNITS capsule Take 10,000 Units by mouth daily.        . furosemide (LASIX) 20 MG tablet Take 10 mg by mouth.       Marland Kitchen ipratropium (ATROVENT) 0.06 % nasal spray 2 sprays by Nasal route 2 (two) times daily.        Marland Kitchen losartan (COZAAR) 100 MG tablet Take 100 mg by mouth daily.        . Nebivolol HCl (BYSTOLIC) 20 MG TABS Take by mouth. Take 1/2 tablet daily      . simvastatin (ZOCOR) 40 MG tablet Take 40 mg by mouth at bedtime.        . tramadol-acetaminophen  (ULTRACET) 37.5-325 MG per tablet Take 1 tablet by mouth daily as needed.        . zolpidem (AMBIEN) 5 MG tablet Take 0.5 tablets (2.5 mg total) by mouth at bedtime as needed for sleep.  30 tablet  1  . DISCONTD: fexofenadine (ALLEGRA) 180 MG tablet Take 180 mg by mouth daily.        Marland Kitchen DISCONTD: omeprazole (PRILOSEC) 20 MG capsule Take 20 mg by mouth daily as needed.        Marland Kitchen DISCONTD: ramelteon (ROZEREM) 8 MG tablet 1/2 tab at bedtime  30 tablet  1     Past Medical History  Diagnosis Date  . Allergy     osteoarthritis  . COPD (chronic obstructive pulmonary disease)   . GERD (gastroesophageal reflux disease)   . Hypertension   . Thyroid nodule   . TIA (transient ischemic attack)   . Vertigo, intermittent   . Hyperlipidemia   . Osteoarthrosis, unspecified whether generalized or localized, unspecified site   . CAD (coronary artery disease)     cath 06/2004 single-vessel cad w/occlusion of right current artery, mild to moderate mitral regurgitation w/preserved left ventricular systolic function Dr. Melburn Popper ? ventricular tachycardia when she was 42  . Cancer     breast  . Cerebrovascular disease   .  Aortic stenosis   . Mitral regurgitation     Past Surgical History  Procedure Date  . Cardiac catheterization 06/2004    single-vessel coronary artery disease with occlusion of the right current artery, mild to moderate mitral regurgitation with preserved left ventricular systolic function Dr Elease Hashimoto .  ? Ventricular tachycardia at 42.  . Mastectomy 1967 and 1997  . Joint replacement 2010    total hip  . Tonsillectomy   . Lobectomy 1970    History   Social History  . Marital Status: Widowed    Spouse Name: N/A    Number of Children: N/A  . Years of Education: N/A   Occupational History  . retired    Social History Main Topics  . Smoking status: Former Games developer  . Smokeless tobacco: Not on file  . Alcohol Use: 8.4 oz/week    14 Glasses of wine per week  . Drug Use: Not on file   . Sexually Active: Not on file   Other Topics Concern  . Not on file   Social History Narrative   RETIREDWIDOW2 SONS 59, 571 DAUGHTER 57FORMER TOBACCO USEETOH YESPT SIGNED DESIGNATED PARTY RELEASE GRANTING ACCESS TO PHI TO DAUGHTER LESLIE DETAILED MESSAGE MAY BE LEFT ON HOME PHONE [facility]JORIE [facility] 11/01/2009 4:12 PM    ROS: no fevers or chills, productive cough, hemoptysis, dysphasia, odynophagia, melena, hematochezia, dysuria, hematuria, rash, seizure activity, orthopnea, PND, pedal edema, claudication. Remaining systems are negative.  Physical Exam: Well-developed well-nourished in no acute distress.  Skin is warm and dry.  HEENT is normal.  Neck is supple. No thyromegaly.  Chest is clear to auscultation with normal expansion.  Cardiovascular exam is regular rate and rhythm. 2/6 systolic murmur left sternal border. Abdominal exam nontender or distended. No masses palpated. Extremities show no edema. neuro grossly intact  ECG Normal sinus rhythm at a rate of 66. No ST changes.

## 2010-10-09 NOTE — Patient Instructions (Signed)
Your physician wants you to follow-up in: ONE YEAR You will receive a reminder letter in the mail two months in advance. If you don't receive a letter, please call our office to schedule the follow-up appointment.   Your physician has requested that you have a carotid duplex. This test is an ultrasound of the carotid arteries in your neck. It looks at blood flow through these arteries that supply the brain with blood. Allow one hour for this exam. There are no restrictions or special instructions.   Your physician has requested that you have an echocardiogram. Echocardiography is a painless test that uses sound waves to create images of your heart. It provides your doctor with information about the size and shape of your heart and how well your heart's chambers and valves are working. This procedure takes approximately one hour. There are no restrictions for this procedure.

## 2010-10-09 NOTE — Assessment & Plan Note (Signed)
Most recent Myoview low risk. Continue aspirin and statin.

## 2010-10-09 NOTE — Assessment & Plan Note (Signed)
Continue aspirin and statin. Followup carotid Dopplers June 2012.

## 2010-10-09 NOTE — Assessment & Plan Note (Signed)
Continue present medications. Blood pressure controlled. 

## 2010-10-15 NOTE — Discharge Summary (Signed)
Janet Allison, Janet Allison                ACCOUNT NO.:  [id number]   MEDICAL RECORD NO.:  [id number]          PATIENT TYPE:  INP   LOCATION:  1616                         FACILITY:  [facility]   PHYSICIAN:  Madlyn Frankel. Charlann Boxer, M.D.  DATE OF BIRTH:  [birth year]   DATE OF ADMISSION:  07/11/2008  DATE OF DISCHARGE:  07/14/2008                               DISCHARGE SUMMARY   ADMISSION DIAGNOSES:  1. Osteoarthritis.  2. Chronic obstructive pulmonary disease with emphysema.  3. Hypertension.  4. Coronary artery disease.  5. Dyslipidemia.  6. Reflux disease.  7. Breast cancer bilateral.   DISCHARGE DIAGNOSES:  1. Osteoarthritis.  2. Chronic obstructive pulmonary disease with emphysema.  3. Hypertension.  4. Coronary artery disease.  5. Dyslipidemia.  6. Reflux disease.  7. Breast cancer bilateral.  8. Mild hyponatremia.   HISTORY OF PRESENT ILLNESS:  An 77 year old female with a history of  left hip pain secondary to osteoarthritis refractory to all conservative  treatment.   PRIMARY CARE PHYSICIAN:  1. Larina Earthly, M.D.  2. She also sees Mark C. Vernie Ammons, M.D.   CARDIOLOGIST:  Vesta Mixer, M.D.   CONSULTATION:  None.   PROCEDURE:  Left total hip replacement.   SURGEON:  Madlyn Frankel. Charlann Boxer, M.D.   ASSISTANT:  Yetta Glassman. Mann, PA.   LABORATORY DATA:  CBC final reading white blood cell 5.4, hemoglobin  9.8, hematocrit 28.9, platelets 140.  Metabolic:  Sodium 128, potassium  4.3, BUN 11, creatinine 0.79, glucose 110, calcium 8.0.  UA was  negative.   DIAGNOSTICS:  1. Portable pelvis postoperatively showed successful left total hip      arthroplasty with no complicating features.  2. Chest two-view showed COPD.  No acute findings.  3. Cardiology:  Full cardiology workup prior to admission.  EKG showed      normal sinus rhythm, possible left atrial enlargement, left axis      deviation.   HOSPITAL COURSE:  The patient admitted to the hospital and underwent  left total hip  replacement.  Tolerated procedure well and admitted to  the orthopedic floor.  Her stay was unremarkable.  She remained  hemodynamically and orthopedically stable.  She made moderate progress  with physical therapy.  She had limited assistance at home.  Due to her  inability to safely maintain activities of daily living, she required  further progress to ensure safe return to home.  Her dressing was  changed.  There was no significant drainage from her wound.  She  remained neurovascularly intact to the left lower extremity throughout.   DISCHARGE DISPOSITION:  Discharged to a skilled nursing facility rehab  in stable and improved condition.   DISCHARGE DIET:  Heart healthy.   DISCHARGE WOUND CARE:  Keep wound dry.  Cover with dressing daily.  She  may shower.  Just cover with plaster or other impervious substance.  Dry  dress after showering.  No bathing.  No submerging.   DISCHARGE PHYSICAL THERAPY:  She is weightbearing as tolerated with the  use of a rolling walker.  I want to encourage independence with  activities  of daily living.   DISCHARGE MEDICATIONS:  1. Lovenox 40 mg subcutaneous q.24 h. x 10 days.  2. Enteric-coated aspirin 325 mg p.o. daily x4 weeks.  3. Robaxin 500 mg p.o. q.6 h.  4. Iron 325 mg p.o. t.i.d. x2 weeks.  5. Colace 100 mg p.o. b.i.d.  6. MiraLax 17 gm p.o. daily.  7. Tramadol/APAP 37.5/325 one-two p.o. q.4-6 h. p.r.n. pain.  8. HCTZ 25 mg 1 p.o. q.a.m.  9. Benicar 40 mg 1 p.o. q.a.m., cannot tolerate generic.  10.Bystolic 20 mg half tab q.a.m., cannot tolerate generic.  11.Simvastatin 80 mg p.o. q.h.s.  12.Ambien 10 mg p.o. q.h.s.  13.Vitamin B3 2000 units daily.  14.Fluticasone nasal spray 50 mcg 2 sprays each nostril once a day as      needed.  15.ProAir Albuterol inhaler 90 mcg 2 puffs every 4 hours as needed.   DISCHARGE FOLLOWUP:  Follow up with Dr. Charlann Boxer at phone number [phone] in  2 weeks for wound check.      ______________________________  Yetta Glassman. [address], [place]      Madlyn Frankel. Charlann Boxer, M.D.  Electronically Signed    BLM/MEDQ  D:  07/14/2008  T:  07/14/2008  Job:  [id number]   cc:   Vesta Mixer, M.D.  Fax: [phone]   Larina Earthly, M.D.  Fax: [phone]   Veverly Fells. Vernie Ammons, M.D.  Fax: [phone]

## 2010-10-15 NOTE — Op Note (Signed)
Janet Allison, Janet Allison                ACCOUNT NO.:  [id number]   MEDICAL RECORD NO.:  [id number]          PATIENT TYPE:  INP   LOCATION:  0007                         FACILITY:  [facility]   PHYSICIAN:  Madlyn Frankel. Charlann Boxer, M.D.  DATE OF BIRTH:  02/29/1926   DATE OF PROCEDURE:  07/11/2008  DATE OF DISCHARGE:                               OPERATIVE REPORT   PREOPERATIVE DIAGNOSIS:  Left hip osteoarthritis.   POSTOPERATIVE DIAGNOSIS:  Left hip osteoarthritis.   PROCEDURE:  Left total hip replacement.   COMPONENTS USED:  DePuy hip system size 52 Pinnacle cup, 36 +4 neutral  Marathon liner, with a 3 high Tri-Lock stem with a 36 +1.5 ceramic ball  due to nickel allergy.   SURGEON:  Madlyn Frankel. Charlann Boxer, M.D.   ASSISTANT:  Yetta Glassman. Mann, P.A.-C   ANESTHESIA:  General.   BLOOD LOSS:  200 mL.   DRAINS:  None.   COMPLICATIONS:  None.   SPECIMEN:  None.   INDICATIONS FOR THE PROCEDURE:  Janet Allison is a pleasant 77 year old  female with advanced left hip degenerative osteoarthritis on  presentation.  She had had a significant reduction in quality of life.  We reviewed the risks of infection, DVT, component failure, dislocation  as well as her nickel allergy, and the proposed plan was for hip  replacement with a ceramic on polyethylene insert.  Consent was obtained  for the benefit of pain relief.   PROCEDURE IN DETAIL:  The patient was brought to the operative theater.  Once adequate anesthesia, preoperative antibiotics, Ancef administered,  the patient was positioned in the right lateral decubitus position with  the left side up.  The left lower extremity was prescrubbed, prepped and  draped in a sterile fashion.  Time-out was performed, identifying the  patient, extremity and planned procedure.  Lateral-based incision was  made for a  posterior approach to the hip.  The iliotibial band and  gluteal fascia were then incised posteriorly.  The short external  rotators were identified and taken  down and separated from the posterior  capsule.  I preserved the posterior leaflet to repair anatomically and  also to protect the sciatic nerve from retractors.  Hip was dislocated.  Advanced degenerative changes noted in both the femoral and acetabular  sides.  Neck osteotomy was made using the oscillating saw based on the  anatomic landmarks and using a trial broach and neck as a guide with the  head in the center of the femoral head.  Attention was first directed to  the femur.  Femoral canal was opened with a box osteotome laterally  initially, then a starting drill, then the hand reamer once.  I then  irrigated to prevent fat emboli.  Initially I had set my anteversion at  20 degrees with the #1 broach and then the #2 broach just right at the  level of the neck cut.   I packed the femur and now attended to the acetabulum.  The acetabular  retractors were placed, labrum was noted be hypertrophic and torn, it  was removed then with a long 15  knife blade.  I then began reaming with  a 43 reamer and carried the reaming all the way up to a 51 reamer, with  excellent bony bed preparation.  My final 52 cup was chosen and impacted  at approximately 35-40 degrees of abduction and 20 degrees of forward  flexion.  Anatomically there was a portion of the cup exposed in the  superior lateral aspect, in addition the fact that it was anterior to  the anterior rim.  I placed 2 cancellous bone screws and then impacted  the final 36 +4 Marathon liner.  This sat circumferentially without any  complication.   At this point I went back to the femur.  We did a trial reduction  initially with a 2.   With the 2 in place, there was lift shuck in extension to make me want  to lengthen her a few mm.  I went ahead and removed the trial 2 and went  to a size 3 broach.  I also increased from the standard to a high offset  neck.  With this, the leg lengths appeared to be comparable to the down  leg.  The hip  range of motion was very well tolerated without  impingement with forward flexion and internal rotation to at least 80  degrees, and no evidence of impingement or subluxation with external  rotation, extension or abduction and external rotation.  Given these  parameters, the final 3 high Tri-Lock stem was chosen.  It was then  impacted into the canal to the level where the broach had sat.  The  final 36 +1.5 delta ceramic ball was utilized due to her nickel allergy.  It was impacted onto a clean and dry trunnion and the hip reduced.  We  had irrigated the hip throughout the case and again at this point.  There was no significant hemostasis required.  I did not utilize a  drain.  I reapproximated the posterior capsule to the superior leaflet  using #1 Vicryl.  A #1 Vicryl was also utilized on the iliotibial band  and gluteal fascia.  The rest of the wound was closed with 2-0 Vicryl  and a running 4-0 Monocryl.  The hip was cleaned, dried and dressed  sterilely with Steri-Strips and a Mepilex dressing, and she was brought  to the recovery room in stable condition, tolerated the procedure well.      Madlyn Frankel Charlann Boxer, M.D.  Electronically Signed     MDO/MEDQ  D:  07/11/2008  T:  07/11/2008  Job:  [id number]

## 2010-10-15 NOTE — H&P (Signed)
Janet Allison, Janet Allison                ACCOUNT NO.:  [id number]   MEDICAL RECORD NO.:  [id number]          PATIENT TYPE:  INP   LOCATION:  NA                           FACILITY:  [facility]   PHYSICIAN:  Madlyn Frankel. Charlann Boxer, M.D.  DATE OF BIRTH:  [birth year]   DATE OF ADMISSION:  07/11/2008  DATE OF DISCHARGE:                              HISTORY & PHYSICAL   PROCEDURE:  Left total hip replacement.   CHIEF COMPLAINT:  Left hip pain.   HISTORY OF PRESENT ILLNESS:  An 77 year old female with a history of  left hip pain secondary to osteoarthritis.  It has been refractory to  all conservative treatment.  She had been presurgically assessed and  cleared by Dr. Elease Hashimoto.   PRIMARY CARE PHYSICIAN:  Dr. Felipa Eth.   CARDIOLOGIST:  Dr. Elease Hashimoto.  Also sees Dr. Vernie Ammons.   MEDICAL HISTORY:  1. Osteoarthritis.  2. COPD, emphysema.  3. Hypertension.  4. Coronary artery disease.  5. Dyslipidemia.  6. Reflux disease.  7. Breast cancer, bilateral.   PAST SURGICAL HISTORY:  Noncontributory.   FAMILY HISTORY:  Stroke, heart disease and cancer.   SOCIAL HISTORY:  Retired, nonsmoker.  Lives alone, planning rehab stay,  prefers [facility], second choices is [facility].   DRUG ALLERGIES:  1. LISINOPRIL WHICH CAUSES COUGHING.  2. SHE ALSO HAS A METAL ALLERGY TO NICKEL.   CURRENT MEDICATIONS:  1. Bystolic 20 mg take 1/2 tablet p.o. daily in the morning, no      substitution.  2. Hydrochlorothiazide 25 mg 1 p.o. daily in the morning.  3. Benicar 40 mg 1 p.o. q.a.m., no substitution.  4. Zocor 40 mg 1 p.o. daily in the evening.  5. Tramadol 37.5/325.  6. APAP p.o. p.r.n.  7. Ambien 10 mg 1/2 tablet p.r.n. nightly.  8. Aspirin 81 mg p.o. daily.  9. Vitamin D daily.  10.Multivitamin daily.  11.ProAir HFA inhaler p.r.n.   REVIEW OF SYSTEMS:  GENERAL:  She has fatigue.  HEENT:  She has ringing in ears.  RESPIRATORY:  She has shortness of breath on exertion.  CARDIOVASCULAR:  Last EKG was done in 2009.  GASTROINTESTINAL:  She has heartburn, difficulty swallowing and loss of  appetite.  MUSCULOSKELETAL:  She has joint pain and morning stiffness.  Otherwise see HPI.   PHYSICAL EXAMINATION:  VITAL SIGNS:  Pulse 72, respirations 16, blood  pressure 128/74.  GENERAL:  Awake, alert and oriented.  HEENT: Normocephalic.  NECK:  Supple.  No carotid bruits.  CHEST/LUNGS:  Clear to auscultation bilaterally.  BREASTS:  Deferred.  HEART:  Regular rate and rhythm.  S1-S2 distinct.  ABDOMEN:  Soft, nontender.  Bowel sounds present.  PELVIS:  Stable.  GENITOURINARY:  Deferred.  EXTREMITIES:  Left hip has decreased range of motion and increased pain.  SKIN:  No cellulitis.  NEUROLOGIC:  Intact distal sensibilities.   LABORATORY DATA:  Labs EKG, chest x-ray all pending presurgical testing.   IMPRESSION:  Left hip osteoarthritis.   PLAN OF ACTION:  Left total hip replacement utilizing ceramic on poly  due to nickel allergy.  Surgery will be performed  [facility]  on July 11, 2008, by surgeon Dr. Durene Romans.  The risks and  complications were discussed.   The patient is planning rehab stay afterwards.  Would prefer  [facility], second choice is [facility].     ______________________________  Yetta Glassman. Loreta Ave, [place]      Madlyn Frankel. Charlann Boxer, M.D.  Electronically Signed    BLM/MEDQ  D:  07/06/2008  T:  07/06/2008  Job:  [id number]   cc:   Larina Earthly, M.D.  Fax: [phone]   Vesta Mixer, M.D.  Fax: [phone]   Veverly Fells. Vernie Ammons, M.D.  Fax: [phone]

## 2010-10-18 NOTE — Cardiovascular Report (Signed)
NAMELAURIN, PAULO                ACCOUNT NO.:  [id number]   MEDICAL RECORD NO.:  [id number]          PATIENT TYPE:  OIB   LOCATION:  2899                         FACILITY:  [facility]   PHYSICIAN:  Vesta Mixer, M.D. DATE OF BIRTH:  [birth year]   DATE OF PROCEDURE:  06/26/2004  DATE OF DISCHARGE:                              CARDIAC CATHETERIZATION   HISTORY:  Ms. Rosier is a 77 year old female with a history of ventricular  tachycardia and COPD. She also has an abnormal EKG. She recently had a  stress Cardiolite study which revealed an inferior defect with evidence of a  previous inferior wall myocardial infarction.. She had not known that she  had had an inferior wall MI so  she is now referred for heart  catheterization for further evaluation.   PROCEDURE:  Left heart catheterization with coronary angiography. The right  femoral artery was easily cannulated using modified Seldinger technique.   Hemodynamics: LV pressure is 166/11 with an aortic pressure of 164/74.   Angiography:  Left main. The left main is fairly normal.   Left anterior descending artery has mild irregularities between 20 and 30%.   The left circumflex artery has mild irregularities. There is a large first  obtuse marginal artery that is normal.   The right coronary artery is occluded proximally. It fills via collaterals  from left to right as well as from right to right collaterals.   The left ventriculogram was performed in the 30 RAO position. It revealed  moderate inferior wall hypokinesis.  The overall ejection fraction is normal  at about 60%.  There is mild to moderate mitral regurgitation.   COMPLICATIONS:  None.   CONCLUSION:  1.  Single-vessel coronary artery disease with an occlusion of the right      coronary artery.  2.  Intact collateral filling of the right coronary artery.  3.  Mild to moderate mitral regurgitation with a well-preserved left      ventricular systolic function. Will  continue with medical therapy.      PJN/MEDQ  D:  06/26/2004  T:  06/26/2004  Job:  [id number]

## 2010-10-31 ENCOUNTER — Telehealth: Payer: Self-pay | Admitting: Internal Medicine

## 2010-10-31 NOTE — Telephone Encounter (Signed)
REFILL TO [facility]  SIMVASTATIN 40 MG TAKE 1 PER DAY  90  LOSARTIN 100 MG TAKE 1 PER DAY 90  BYSTOLIC 20 MG TAKE 1 PER DAY 90  FUROSEMIDE 10 MG TAKE 1 PER DAY 90

## 2010-11-01 MED ORDER — FUROSEMIDE 20 MG PO TABS: 10.0000 mg | ORAL_TABLET | Freq: Every day | ORAL | Status: DC

## 2010-11-01 MED ORDER — NEBIVOLOL HCL 20 MG PO TABS: 20.0000 mg | ORAL_TABLET | Freq: Every day | ORAL | Status: DC

## 2010-11-01 MED ORDER — SIMVASTATIN 40 MG PO TABS: 40.0000 mg | ORAL_TABLET | Freq: Every day | ORAL | Status: DC

## 2010-11-01 MED ORDER — LOSARTAN POTASSIUM 100 MG PO TABS: 100.0000 mg | ORAL_TABLET | Freq: Every day | ORAL | Status: DC

## 2010-11-01 NOTE — Telephone Encounter (Signed)
Rx refills sent to [facility], no Furosemide does not come in a 10 mg tablet.

## 2010-11-07 ENCOUNTER — Encounter (INDEPENDENT_AMBULATORY_CARE_PROVIDER_SITE_OTHER): Payer: Medicare Other | Admitting: *Deleted

## 2010-11-07 ENCOUNTER — Ambulatory Visit (HOSPITAL_COMMUNITY): Payer: Medicare Other | Attending: Internal Medicine | Admitting: Radiology

## 2010-11-07 ENCOUNTER — Other Ambulatory Visit: Payer: Self-pay | Admitting: *Deleted

## 2010-11-07 DIAGNOSIS — I10 Essential (primary) hypertension: Secondary | ICD-10-CM | POA: Insufficient documentation

## 2010-11-07 DIAGNOSIS — I6529 Occlusion and stenosis of unspecified carotid artery: Secondary | ICD-10-CM

## 2010-11-07 DIAGNOSIS — E785 Hyperlipidemia, unspecified: Secondary | ICD-10-CM | POA: Insufficient documentation

## 2010-11-07 DIAGNOSIS — I359 Nonrheumatic aortic valve disorder, unspecified: Secondary | ICD-10-CM

## 2010-11-07 DIAGNOSIS — I079 Rheumatic tricuspid valve disease, unspecified: Secondary | ICD-10-CM | POA: Insufficient documentation

## 2010-11-07 DIAGNOSIS — I080 Rheumatic disorders of both mitral and aortic valves: Secondary | ICD-10-CM | POA: Insufficient documentation

## 2010-11-11 ENCOUNTER — Encounter: Payer: Self-pay | Admitting: Cardiology

## 2010-11-27 ENCOUNTER — Telehealth: Payer: Self-pay | Admitting: Internal Medicine

## 2010-11-27 MED ORDER — TRAMADOL-ACETAMINOPHEN 37.5-325 MG PO TABS: 1.0000 | ORAL_TABLET | Freq: Every day | ORAL | Status: DC | PRN

## 2010-11-27 NOTE — Telephone Encounter (Signed)
OK to refill with same sig same number, no extra refills

## 2010-11-27 NOTE — Telephone Encounter (Signed)
Rx refill sent to pharmacy. 

## 2010-11-27 NOTE — Telephone Encounter (Signed)
Refill- tramadol/apap 37.5mg /325mg  tabs. Take 1 tablet by mouth once daily as needed. Qty 30. Last fill 3.5.12

## 2010-12-09 ENCOUNTER — Telehealth: Payer: Self-pay | Admitting: Internal Medicine

## 2010-12-09 NOTE — Telephone Encounter (Signed)
Pt wants to know if she will need a referral to a podiatrist? She has torn off half a toenail off.

## 2010-12-09 NOTE — Telephone Encounter (Signed)
Pt states she hit her foot on a concrete post and ripped her nail.  Nail is still partially attached and needs to be removed. Advised pt she does not need referral from our office and she can call directly.  Pt asked if we had any preferences. Advised pt Dr. Harriet Pho at [facility] has been well received by patients in the past.  She will call to schedule appt.

## 2010-12-20 ENCOUNTER — Telehealth: Payer: Self-pay | Admitting: Internal Medicine

## 2010-12-20 MED ORDER — IPRATROPIUM BROMIDE 0.06 % NA SOLN: 2.0000 | Freq: Two times a day (BID) | NASAL | Status: DC

## 2010-12-20 NOTE — Telephone Encounter (Signed)
Pt.notified

## 2010-12-20 NOTE — Telephone Encounter (Signed)
Please send refill of ipratropium(nasal spray) to [facility] on [address]. Patient states that she is going out of town on Monday.

## 2011-02-19 ENCOUNTER — Encounter: Payer: Self-pay | Admitting: Internal Medicine

## 2011-02-19 ENCOUNTER — Ambulatory Visit (INDEPENDENT_AMBULATORY_CARE_PROVIDER_SITE_OTHER): Payer: Medicare Other | Admitting: Internal Medicine

## 2011-02-19 DIAGNOSIS — J069 Acute upper respiratory infection, unspecified: Secondary | ICD-10-CM

## 2011-02-19 MED ORDER — DOXYCYCLINE HYCLATE 100 MG PO TABS: 100.0000 mg | ORAL_TABLET | Freq: Two times a day (BID) | ORAL | Status: AC

## 2011-02-19 MED ORDER — ALBUTEROL SULFATE HFA 108 (90 BASE) MCG/ACT IN AERS: 2.0000 | INHALATION_SPRAY | Freq: Every day | RESPIRATORY_TRACT | Status: DC

## 2011-02-20 ENCOUNTER — Telehealth: Payer: Self-pay | Admitting: *Deleted

## 2011-02-20 MED ORDER — ALBUTEROL SULFATE HFA 108 (90 BASE) MCG/ACT IN AERS: INHALATION_SPRAY | RESPIRATORY_TRACT | Status: DC

## 2011-02-20 NOTE — Telephone Encounter (Signed)
Voice message received from [facility] pharmacy. Clarification on Albuterol directions are needed. Rx states 2 puffs into the lungs daily. Pharmacy stated rescue inhaler and wanted to know if they should dispense as directed.

## 2011-02-20 NOTE — Telephone Encounter (Signed)
2 puffs q4-6 hours prn

## 2011-02-20 NOTE — Telephone Encounter (Signed)
Rx resent to pharmacy with signature correction.

## 2011-02-23 DIAGNOSIS — J069 Acute upper respiratory infection, unspecified: Secondary | ICD-10-CM | POA: Insufficient documentation

## 2011-02-23 NOTE — Assessment & Plan Note (Signed)
Suspect possible viral etiology currently. otc sx tx prn. Given abx prescription to hold. Begin abx if no improvement of sx's after total duration of 8-10 days. Followup if no improvement or worsening.

## 2011-02-23 NOTE — Progress Notes (Signed)
  Subjective:    Patient ID: Janet Allison, female    DOB: [birth year], 77 y.o.   MRN: [id number]  HPI Pt presents to clinic for evaluation of ear pain. Notes <1 wk h/o left ear pain without injury/drainage or fever. +nasal drainage and cough productive for yellow sputum. No hemoptysis. No exacerbating or alleviating factors. Taking no medications for the problem. No other complaints.    Review of Systems see hpi     Objective:   Physical Exam  Nursing note and vitals reviewed. Constitutional: She appears well-developed and well-nourished. No distress.  HENT:  Head: Normocephalic and atraumatic.  Right Ear: Tympanic membrane, external ear and ear canal normal.  Left Ear: Tympanic membrane, external ear and ear canal normal.  Nose: Nose normal.  Mouth/Throat: Oropharynx is clear and moist. No oropharyngeal exudate.  Eyes: Conjunctivae are normal. Right eye exhibits no discharge. Left eye exhibits no discharge. No scleral icterus.  Neck: Neck supple.  Cardiovascular: Normal rate, regular rhythm and normal heart sounds.   Pulmonary/Chest: Effort normal and breath sounds normal. No respiratory distress. She has no wheezes. She has no rales.  Lymphadenopathy:    She has no cervical adenopathy.  Neurological: She is alert.  Skin: Skin is warm and dry. She is not diaphoretic.  Psychiatric: She has a normal mood and affect.          Assessment & Plan:

## 2011-03-26 ENCOUNTER — Ambulatory Visit (INDEPENDENT_AMBULATORY_CARE_PROVIDER_SITE_OTHER): Payer: Medicare Other

## 2011-03-26 DIAGNOSIS — Z23 Encounter for immunization: Secondary | ICD-10-CM

## 2011-05-09 ENCOUNTER — Telehealth: Payer: Self-pay | Admitting: Internal Medicine

## 2011-05-09 NOTE — Telephone Encounter (Signed)
[facility] mail order  [facility] [address]   Please fill tramadol 325 and bystolic 20mg 

## 2011-05-09 NOTE — Telephone Encounter (Signed)
Dr Rodena Medin is it okay to provide refill on Tramadol for this patient?

## 2011-05-09 NOTE — Telephone Encounter (Signed)
Yes but needs to keep 12/11 appt

## 2011-05-12 MED ORDER — TRAMADOL-ACETAMINOPHEN 37.5-325 MG PO TABS: 1.0000 | ORAL_TABLET | Freq: Every day | ORAL | Status: DC | PRN

## 2011-05-12 MED ORDER — NEBIVOLOL HCL 20 MG PO TABS: 20.0000 mg | ORAL_TABLET | Freq: Every day | ORAL | Status: DC

## 2011-05-12 NOTE — Telephone Encounter (Signed)
Rx refill sent to pharmacy. 

## 2011-05-13 ENCOUNTER — Encounter: Payer: Self-pay | Admitting: Internal Medicine

## 2011-05-13 ENCOUNTER — Telehealth: Payer: Self-pay | Admitting: *Deleted

## 2011-05-13 ENCOUNTER — Ambulatory Visit (INDEPENDENT_AMBULATORY_CARE_PROVIDER_SITE_OTHER): Payer: Medicare Other | Admitting: Internal Medicine

## 2011-05-13 DIAGNOSIS — R42 Dizziness and giddiness: Secondary | ICD-10-CM

## 2011-05-13 DIAGNOSIS — R5383 Other fatigue: Secondary | ICD-10-CM

## 2011-05-13 DIAGNOSIS — Z79899 Other long term (current) drug therapy: Secondary | ICD-10-CM

## 2011-05-13 DIAGNOSIS — E785 Hyperlipidemia, unspecified: Secondary | ICD-10-CM

## 2011-05-13 LAB — BASIC METABOLIC PANEL
CO2: 28 mEq/L (ref 19–32)
Calcium: 9.9 mg/dL (ref 8.4–10.5)
Creat: 1.03 mg/dL (ref 0.50–1.10)
Glucose, Bld: 87 mg/dL (ref 70–99)

## 2011-05-13 LAB — CBC
Hemoglobin: 14.7 g/dL (ref 12.0–15.0)
MCH: 32.2 pg (ref 26.0–34.0)
MCV: 98.2 fL (ref 78.0–100.0)
RBC: 4.57 MIL/uL (ref 3.87–5.11)

## 2011-05-13 LAB — HEPATIC FUNCTION PANEL
AST: 21 U/L (ref 0–37)
Alkaline Phosphatase: 57 U/L (ref 39–117)
Bilirubin, Direct: 0.2 mg/dL (ref 0.0–0.3)
Total Bilirubin: 1 mg/dL (ref 0.3–1.2)

## 2011-05-13 MED ORDER — LOSARTAN POTASSIUM 50 MG PO TABS: 100.0000 mg | ORAL_TABLET | Freq: Every day | ORAL | Status: DC

## 2011-05-13 NOTE — Telephone Encounter (Signed)
Notified pharmacist directions should be 1 tablet daily.

## 2011-05-13 NOTE — Telephone Encounter (Signed)
Received call from pharmacy requesting clarification of Rx sent in for Losartan 50mg . Take 2 tabs daily #30. Please verify directions and quantity.

## 2011-05-13 NOTE — Telephone Encounter (Signed)
One a day

## 2011-05-14 DIAGNOSIS — R42 Dizziness and giddiness: Secondary | ICD-10-CM | POA: Insufficient documentation

## 2011-05-14 DIAGNOSIS — R5383 Other fatigue: Secondary | ICD-10-CM | POA: Insufficient documentation

## 2011-05-14 NOTE — Assessment & Plan Note (Signed)
Suspect contribution from low nl bp. Decrease losartan 50mg  po qd. Monitor bp regularly at home. Followup if no improvement or worsening.

## 2011-05-14 NOTE — Assessment & Plan Note (Signed)
Obtain cbc, chem7.  

## 2011-05-14 NOTE — Assessment & Plan Note (Signed)
Obtain lipid/lft. 

## 2011-05-14 NOTE — Progress Notes (Signed)
  Subjective:    Patient ID: Janet Allison, female    DOB: November 06, 1925, 77 y.o.   MRN: [id number]  HPI Pt presents to clinic for followup of multiple medical problems. Notes mild generalized fatigue and intermittent dizziness without syncope or associated palpitations. Home SBP's intermittently 90's. Tolerating statin therapy without myalgias or abn lfts. No other complaints.  Past Medical History  Diagnosis Date  . Allergy     osteoarthritis  . COPD (chronic obstructive pulmonary disease)   . GERD (gastroesophageal reflux disease)   . Hypertension   . Thyroid nodule   . TIA (transient ischemic attack)   . Vertigo, intermittent   . Hyperlipidemia   . Osteoarthrosis, unspecified whether generalized or localized, unspecified site   . CAD (coronary artery disease)     cath 06/2004 single-vessel cad w/occlusion of right current artery, mild to moderate mitral regurgitation w/preserved left ventricular systolic function Dr. Melburn Popper ? ventricular tachycardia when she was 42  . Cancer     breast  . Cerebrovascular disease   . Aortic stenosis   . Mitral regurgitation    Past Surgical History  Procedure Date  . Cardiac catheterization 06/2004    single-vessel coronary artery disease with occlusion of the right current artery, mild to moderate mitral regurgitation with preserved left ventricular systolic function Dr Elease Hashimoto .  ? Ventricular tachycardia at 42.  . Mastectomy 1967 and 1997  . Joint replacement 2010    total hip  . Tonsillectomy   . Lobectomy 1970    reports that she has quit smoking. She has never used smokeless tobacco. She reports that she drinks about 8.4 ounces of alcohol per week. Her drug history not on file. family history includes Heart disease in her mother. No Known Allergies   Review of Systems see hpi     Objective:   Physical Exam  Physical Exam  Nursing note and vitals reviewed. Constitutional: Appears well-developed and well-nourished. No distress.    HENT:  Head: Normocephalic and atraumatic.  Right Ear: External ear normal.  Left Ear: External ear normal.  Eyes: Conjunctivae are normal. No scleral icterus.  Neck: Neck supple. Carotid bruit is not present.  Cardiovascular: Normal rate, regular rhythm and normal heart sounds.  Exam reveals no gallop and no friction rub.   No murmur heard. Pulmonary/Chest: Effort normal and breath sounds normal. No respiratory distress. He has no wheezes. no rales.  Lymphadenopathy:    He has no cervical adenopathy.  Neurological:Alert.  Skin: Skin is warm and dry. Not diaphoretic.  Psychiatric: Has a normal mood and affect.        Assessment & Plan:

## 2011-06-05 ENCOUNTER — Telehealth: Payer: Self-pay | Admitting: Internal Medicine

## 2011-06-05 ENCOUNTER — Encounter: Payer: Self-pay | Admitting: Internal Medicine

## 2011-06-05 ENCOUNTER — Ambulatory Visit (INDEPENDENT_AMBULATORY_CARE_PROVIDER_SITE_OTHER): Payer: Medicare Other | Admitting: Internal Medicine

## 2011-06-05 DIAGNOSIS — E785 Hyperlipidemia, unspecified: Secondary | ICD-10-CM

## 2011-06-05 DIAGNOSIS — I10 Essential (primary) hypertension: Secondary | ICD-10-CM

## 2011-06-05 DIAGNOSIS — R5381 Other malaise: Secondary | ICD-10-CM

## 2011-06-05 DIAGNOSIS — E079 Disorder of thyroid, unspecified: Secondary | ICD-10-CM

## 2011-06-05 DIAGNOSIS — R5383 Other fatigue: Secondary | ICD-10-CM

## 2011-06-05 DIAGNOSIS — Z79899 Other long term (current) drug therapy: Secondary | ICD-10-CM

## 2011-06-05 MED ORDER — LOSARTAN POTASSIUM 50 MG PO TABS: 50.0000 mg | ORAL_TABLET | Freq: Every day | ORAL | Status: DC

## 2011-06-05 MED ORDER — ALBUTEROL SULFATE HFA 108 (90 BASE) MCG/ACT IN AERS: INHALATION_SPRAY | RESPIRATORY_TRACT | Status: DC

## 2011-06-05 MED ORDER — IPRATROPIUM BROMIDE 0.06 % NA SOLN: 2.0000 | Freq: Two times a day (BID) | NASAL | Status: DC

## 2011-06-05 MED ORDER — TRAMADOL-ACETAMINOPHEN 37.5-325 MG PO TABS: 1.0000 | ORAL_TABLET | Freq: Every day | ORAL | Status: DC | PRN

## 2011-06-05 NOTE — Assessment & Plan Note (Signed)
Stable. Obtain lipid/lft prior to next visit. 

## 2011-06-05 NOTE — Assessment & Plan Note (Signed)
Stable. Schedule tsh and ft4 prior to next visit

## 2011-06-05 NOTE — Assessment & Plan Note (Signed)
Improved with normotensive blood pressure

## 2011-06-05 NOTE — Telephone Encounter (Signed)
Lab orders entered for June 2013. 

## 2011-06-05 NOTE — Telephone Encounter (Signed)
Patient has follow up appt 11/03/11. Please schedule tsh/free t4 (hypothyroidism), chem7 (v58.69) and lipid/lft (272.4) prior to next visit. Patient will be going to [facility] lab

## 2011-06-05 NOTE — Assessment & Plan Note (Signed)
Normotensive and stable. Continue current regimen. Monitor bp as outpt and followup in clinic as scheduled.  

## 2011-06-05 NOTE — Progress Notes (Signed)
  Subjective:    Patient ID: Janet Allison, female    DOB: Jun 20, 1925, 78 y.o.   MRN: [id number]  HPI Pt presents to clinic for followup of multiple medical problems. Previous fatigue is improved after dose of cozaar was decreased. Reviewed lab evaluation nl. BP reviewed as normotensive. Overall feels well without complaint.   Past Medical History  Diagnosis Date  . Allergy     osteoarthritis  . COPD (chronic obstructive pulmonary disease)   . GERD (gastroesophageal reflux disease)   . Hypertension   . Thyroid nodule   . TIA (transient ischemic attack)   . Vertigo, intermittent   . Hyperlipidemia   . Osteoarthrosis, unspecified whether generalized or localized, unspecified site   . CAD (coronary artery disease)     cath 06/2004 single-vessel cad w/occlusion of right current artery, mild to moderate mitral regurgitation w/preserved left ventricular systolic function Dr. Melburn Popper ? ventricular tachycardia when she was 42  . Cancer     breast  . Cerebrovascular disease   . Aortic stenosis   . Mitral regurgitation    Past Surgical History  Procedure Date  . Cardiac catheterization 06/2004    single-vessel coronary artery disease with occlusion of the right current artery, mild to moderate mitral regurgitation with preserved left ventricular systolic function Dr Elease Hashimoto .  ? Ventricular tachycardia at 42.  . Mastectomy 1967 and 1997  . Joint replacement 2010    total hip  . Tonsillectomy   . Lobectomy 1970    reports that she has quit smoking. She has never used smokeless tobacco. She reports that she drinks about 8.4 ounces of alcohol per week. Her drug history not on file. family history includes Heart disease in her mother. No Known Allergies    Review of Systems see hpi     Objective:   Physical Exam  Physical Exam  Nursing note and vitals reviewed. Constitutional: Appears well-developed and well-nourished. No distress.  HENT:  Head: Normocephalic and atraumatic.  Right  Ear: External ear normal.  Left Ear: External ear normal.  Eyes: Conjunctivae are normal. No scleral icterus.  Neck: Neck supple. Carotid bruit is not present.  Cardiovascular: Normal rate, regular rhythm and normal heart sounds.  Exam reveals no gallop and no friction rub.   No murmur heard. Pulmonary/Chest: Effort normal and breath sounds normal. No respiratory distress. He has no wheezes. no rales.  Lymphadenopathy:    He has no cervical adenopathy.  Neurological:Alert.  Skin: Skin is warm and dry. Not diaphoretic.  Psychiatric: Has a normal mood and affect.        Assessment & Plan:

## 2011-06-05 NOTE — Patient Instructions (Signed)
Please schedule tsh/free t4 (hypothyroidism), chem7 (v58.69) and lipid/lft (272.4) prior to next visit 

## 2011-06-10 ENCOUNTER — Other Ambulatory Visit: Payer: Self-pay | Admitting: *Deleted

## 2011-06-10 MED ORDER — SIMVASTATIN 40 MG PO TABS: 40.0000 mg | ORAL_TABLET | Freq: Every day | ORAL | Status: DC

## 2011-06-10 NOTE — Telephone Encounter (Signed)
Patient called and left voice message requesting a refill on Simvastatin to the local pharmacy.Rx refill sent to pharmacy.

## 2011-06-12 ENCOUNTER — Encounter: Payer: Self-pay | Admitting: Internal Medicine

## 2011-08-07 ENCOUNTER — Other Ambulatory Visit: Payer: Self-pay | Admitting: *Deleted

## 2011-08-07 MED ORDER — SIMVASTATIN 40 MG PO TABS: 40.0000 mg | ORAL_TABLET | Freq: Every day | ORAL | Status: DC

## 2011-08-07 MED ORDER — ZOLPIDEM TARTRATE 5 MG PO TABS: 2.5000 mg | ORAL_TABLET | Freq: Every evening | ORAL | Status: DC | PRN

## 2011-08-07 NOTE — Telephone Encounter (Signed)
Patient called requesting Rx refills on Simvastatin and Ambien to [facility].Marland Kitchen Rx printed and sent to provider for signature.

## 2011-08-08 NOTE — Telephone Encounter (Signed)
Rx's faxed to [facility] at [phone]- [phone]

## 2011-09-04 ENCOUNTER — Telehealth: Payer: Self-pay | Admitting: Internal Medicine

## 2011-09-04 NOTE — Telephone Encounter (Signed)
Patient states that she has been having chest discomfort for past two weeks. Pain has been on/off. Please assist.

## 2011-09-04 NOTE — Telephone Encounter (Signed)
Call placed to patient at [phone],she complains of left side intermittent pain  below breast bone for the past 2 weeks.  She stated today she was cleaning in the middle of the afternoon, and she had a sudden onset of a nagging aching pain in her chest. She was asked about shortness of breath and she states she always have shortness of breath due to her COPD. She stated that she is scheduled to follow up with Cardiology in May. She was informed per Dr. Rodena Medin that she should seek care in the Emergency for evaluation. Patient states she feels that may not be necessary and that she will follow up with Cardiology in the morning. Patient reminded because of her age and what she stated as a change in the pain in her chest, it would be best for her to be evaluated in the emergency room. Patient verbalized an understanding, however no acknowledgement of whether she is going to report.

## 2011-09-05 ENCOUNTER — Telehealth: Payer: Self-pay | Admitting: Cardiovascular Disease

## 2011-09-05 ENCOUNTER — Encounter (HOSPITAL_BASED_OUTPATIENT_CLINIC_OR_DEPARTMENT_OTHER): Payer: Self-pay

## 2011-09-05 ENCOUNTER — Emergency Department (INDEPENDENT_AMBULATORY_CARE_PROVIDER_SITE_OTHER): Payer: Medicare Other

## 2011-09-05 ENCOUNTER — Observation Stay (HOSPITAL_BASED_OUTPATIENT_CLINIC_OR_DEPARTMENT_OTHER)
Admission: EM | Admit: 2011-09-05 | Discharge: 2011-09-06 | DRG: 313 | Disposition: A | Discharge status: Home / Self Care | Payer: Medicare Other | Attending: Cardiovascular Disease | Admitting: Cardiovascular Disease

## 2011-09-05 ENCOUNTER — Other Ambulatory Visit: Payer: Self-pay

## 2011-09-05 DIAGNOSIS — R079 Chest pain, unspecified: Secondary | ICD-10-CM

## 2011-09-05 DIAGNOSIS — J4489 Other specified chronic obstructive pulmonary disease: Secondary | ICD-10-CM | POA: Insufficient documentation

## 2011-09-05 DIAGNOSIS — E785 Hyperlipidemia, unspecified: Secondary | ICD-10-CM | POA: Insufficient documentation

## 2011-09-05 DIAGNOSIS — Z853 Personal history of malignant neoplasm of breast: Secondary | ICD-10-CM | POA: Insufficient documentation

## 2011-09-05 DIAGNOSIS — R0602 Shortness of breath: Secondary | ICD-10-CM

## 2011-09-05 DIAGNOSIS — I059 Rheumatic mitral valve disease, unspecified: Secondary | ICD-10-CM | POA: Insufficient documentation

## 2011-09-05 DIAGNOSIS — Z9010 Acquired absence of unspecified breast and nipple: Secondary | ICD-10-CM | POA: Insufficient documentation

## 2011-09-05 DIAGNOSIS — K219 Gastro-esophageal reflux disease without esophagitis: Secondary | ICD-10-CM | POA: Insufficient documentation

## 2011-09-05 DIAGNOSIS — R0789 Other chest pain: Principal | ICD-10-CM | POA: Insufficient documentation

## 2011-09-05 DIAGNOSIS — I359 Nonrheumatic aortic valve disorder, unspecified: Secondary | ICD-10-CM | POA: Insufficient documentation

## 2011-09-05 DIAGNOSIS — I2510 Atherosclerotic heart disease of native coronary artery without angina pectoris: Secondary | ICD-10-CM | POA: Insufficient documentation

## 2011-09-05 DIAGNOSIS — I10 Essential (primary) hypertension: Secondary | ICD-10-CM | POA: Insufficient documentation

## 2011-09-05 DIAGNOSIS — J449 Chronic obstructive pulmonary disease, unspecified: Secondary | ICD-10-CM | POA: Insufficient documentation

## 2011-09-05 DIAGNOSIS — R748 Abnormal levels of other serum enzymes: Secondary | ICD-10-CM

## 2011-09-05 DIAGNOSIS — M1990 Unspecified osteoarthritis, unspecified site: Secondary | ICD-10-CM | POA: Insufficient documentation

## 2011-09-05 LAB — DIFFERENTIAL
Basophils Absolute: 0 10*3/uL (ref 0.0–0.1)
Basophils Relative: 0 % (ref 0–1)
Eosinophils Absolute: 0.1 10*3/uL (ref 0.0–0.7)
Eosinophils Relative: 2 % (ref 0–5)
Lymphocytes Relative: 24 % (ref 12–46)
Lymphs Abs: 1.3 10*3/uL (ref 0.7–4.0)
Monocytes Absolute: 0.6 10*3/uL (ref 0.1–1.0)
Monocytes Relative: 11 % (ref 3–12)
Neutro Abs: 3.3 10*3/uL (ref 1.7–7.7)
Neutrophils Relative %: 63 % (ref 43–77)

## 2011-09-05 LAB — CARDIAC PANEL(CRET KIN+CKTOT+MB+TROPI)
CK, MB: 13.3 ng/mL (ref 0.3–4.0)
Relative Index: 5.9 — ABNORMAL HIGH (ref 0.0–2.5)
Relative Index: 5.2 — ABNORMAL HIGH (ref 0.0–2.5)
Relative Index: 4.9 — ABNORMAL HIGH (ref 0.0–2.5)
Total CK: 225 U/L — ABNORMAL HIGH (ref 7–177)
Total CK: 188 U/L — ABNORMAL HIGH (ref 7–177)
Total CK: 167 U/L (ref 7–177)
Troponin I: <0.3 ng/mL
Troponin I: <0.3 ng/mL (ref <0.30)

## 2011-09-05 LAB — CBC
HCT: 41.8 % (ref 36.0–46.0)
Hemoglobin: 14.6 g/dL (ref 12.0–15.0)
Hemoglobin: 14 g/dL (ref 12.0–15.0)
MCH: 32.7 pg (ref 26.0–34.0)
MCH: 32.3 pg (ref 26.0–34.0)
MCHC: 33.5 g/dL (ref 30.0–36.0)
MCV: 96.2 fL (ref 78.0–100.0)
MCV: 96.5 fL (ref 78.0–100.0)
Platelets: 157 10*3/uL (ref 150–400)
RBC: 4.46 MIL/uL (ref 3.87–5.11)
RDW: 13.7 % (ref 11.5–15.5)
WBC: 5.3 10*3/uL (ref 4.0–10.5)

## 2011-09-05 LAB — BASIC METABOLIC PANEL
CO2: 28 mEq/L (ref 19–32)
Calcium: 10.2 mg/dL (ref 8.4–10.5)
Creatinine, Ser: 0.9 mg/dL (ref 0.50–1.10)
GFR calc Af Amer: 67 mL/min — ABNORMAL LOW (ref >90)

## 2011-09-05 LAB — CREATININE, SERUM: GFR calc non Af Amer: 64 mL/min — ABNORMAL LOW (ref >90)

## 2011-09-05 MED ORDER — FUROSEMIDE 20 MG PO TABS
10.0000 mg | ORAL_TABLET | Freq: Every day | ORAL | Status: DC
  Administered 2011-09-06: 10 mg via ORAL
  Filled 2011-09-05 (×2): qty 0.5

## 2011-09-05 MED ORDER — NITROGLYCERIN 0.4 MG SL SUBL: 0.4000 mg | SUBLINGUAL_TABLET | SUBLINGUAL | Status: DC | PRN

## 2011-09-05 MED ORDER — SODIUM CHLORIDE 0.9 % IV SOLN: 250.0000 mL | INTRAVENOUS | Status: DC | PRN

## 2011-09-05 MED ORDER — ACETAMINOPHEN 325 MG PO TABS: 650.0000 mg | ORAL_TABLET | ORAL | Status: DC | PRN

## 2011-09-05 MED ORDER — ALBUTEROL SULFATE HFA 108 (90 BASE) MCG/ACT IN AERS
2.0000 | INHALATION_SPRAY | RESPIRATORY_TRACT | Status: DC | PRN
  Filled 2011-09-05: qty 6.7

## 2011-09-05 MED ORDER — LOSARTAN POTASSIUM 50 MG PO TABS
50.0000 mg | ORAL_TABLET | Freq: Every day | ORAL | Status: DC
  Administered 2011-09-06: 50 mg via ORAL
  Filled 2011-09-05 (×2): qty 1

## 2011-09-05 MED ORDER — ASPIRIN 81 MG PO TABS: 81.0000 mg | ORAL_TABLET | Freq: Every day | ORAL | Status: DC

## 2011-09-05 MED ORDER — CHOLECALCIFEROL 400 UNIT/ML PO LIQD
10000.0000 [IU] | Freq: Every day | ORAL | Status: DC
  Filled 2011-09-05: qty 25

## 2011-09-05 MED ORDER — ASPIRIN 81 MG PO CHEW
324.0000 mg | CHEWABLE_TABLET | Freq: Once | ORAL | Status: AC
  Administered 2011-09-05: 324 mg via ORAL
  Filled 2011-09-05: qty 3
  Filled 2011-09-05: qty 1

## 2011-09-05 MED ORDER — SIMVASTATIN 40 MG PO TABS
40.0000 mg | ORAL_TABLET | Freq: Every day | ORAL | Status: DC
  Administered 2011-09-05: 40 mg via ORAL
  Filled 2011-09-05 (×2): qty 1

## 2011-09-05 MED ORDER — ZOLPIDEM TARTRATE 5 MG PO TABS
5.0000 mg | ORAL_TABLET | Freq: Every evening | ORAL | Status: DC | PRN
  Administered 2011-09-05: 5 mg via ORAL
  Filled 2011-09-05: qty 1

## 2011-09-05 MED ORDER — NEBIVOLOL HCL 10 MG PO TABS
20.0000 mg | ORAL_TABLET | Freq: Every day | ORAL | Status: DC
  Administered 2011-09-06: 20 mg via ORAL
  Filled 2011-09-05 (×2): qty 2

## 2011-09-05 MED ORDER — IPRATROPIUM BROMIDE 0.06 % NA SOLN
2.0000 | Freq: Two times a day (BID) | NASAL | Status: DC
  Filled 2011-09-05: qty 15

## 2011-09-05 MED ORDER — SODIUM CHLORIDE 0.9 % IJ SOLN: 3.0000 mL | INTRAMUSCULAR | Status: DC | PRN

## 2011-09-05 MED ORDER — ONDANSETRON HCL 4 MG/2ML IJ SOLN: 4.0000 mg | Freq: Four times a day (QID) | INTRAMUSCULAR | Status: DC | PRN

## 2011-09-05 MED ORDER — ENOXAPARIN SODIUM 40 MG/0.4ML ~~LOC~~ SOLN
40.0000 mg | SUBCUTANEOUS | Status: DC
  Administered 2011-09-05: 40 mg via SUBCUTANEOUS
  Filled 2011-09-05 (×2): qty 0.4

## 2011-09-05 MED ORDER — SODIUM CHLORIDE 0.9 % IJ SOLN
3.0000 mL | Freq: Two times a day (BID) | INTRAMUSCULAR | Status: DC
  Administered 2011-09-05 – 2011-09-06 (×2): 3 mL via INTRAVENOUS

## 2011-09-05 MED ORDER — ASPIRIN 81 MG PO CHEW
81.0000 mg | CHEWABLE_TABLET | Freq: Every day | ORAL | Status: DC
  Administered 2011-09-06: 81 mg via ORAL
  Filled 2011-09-05: qty 1

## 2011-09-05 MED ORDER — TRAMADOL-ACETAMINOPHEN 37.5-325 MG PO TABS
1.0000 | ORAL_TABLET | Freq: Every day | ORAL | Status: DC | PRN
  Filled 2011-09-05: qty 1

## 2011-09-05 NOTE — ED Notes (Addendum)
C/o chronic CP, SOB "for years since my 40s-ventricular tachycardia"-pt feels s/s have worsened over the "past weeks"-denies pain at this time-NAD-EKG done upon arrival to tx area

## 2011-09-05 NOTE — Telephone Encounter (Signed)
Received phone call today from the [facility]. Pt is followed by Dr. Jens Som in our office. She has had chest discomfort for several weeks with exertion. She was told by primary care this am to go the Urgent Care. She has been evaluated by the Urgent Care physician and she has no objective evidence of ischemia. Labs are WNL. She has no chest pain this am. I do not think she needs to be admitted today. If she has recurrent chest pain over the weekend, she should seek medical attention. Can we add her on to see Clearence Cheek or Clovis Riley next week? The patient will be expecting a call from our office today. I will send this to our Triage Nurse.   Thanks, Media planner

## 2011-09-05 NOTE — Telephone Encounter (Signed)
Dr Clifton James called and pt is being admitted to [facility].

## 2011-09-05 NOTE — H&P (Signed)
History and Physical  Patient ID: Janet Allison MRN: [id number], SOB: 05/16/1926 78 y.o. Date of Encounter: 09/05/2011, 3:16 PM  Primary Physician: Letitia Libra, Ala Dach, MD Primary Cardiologist: Olga Millers, MD  Chief Complaint: Chest Pain  HPI: 78 y.o. female w/ PMHx significant for breast CA (s/p bilat mastectomy), HLD, HTN, TIA, mild AS/MR, and CAD (by cath '06, medically treated) who presented to [facility] with complaints of chest pain and found to have elevated CKMB and subsequently transferred to [facility] on 09/05/11.  Cardiac catheterization in 2006 revealed a 20-30% LAD and an occluded RCA with left to right collaterals, EF 60% with inferior hypokinesis. She is treated medically. Myoview in March of 2011 revealed EF 75% and inferior scar/ischemia could not be excluded. Echocardiogram in June of 2012 showed normal LV function EF 65-70%, mild LVH, no RWMA, mild AS/AI, mild MS/MR.   She reports many years of occasional chest discomfort "not pain" on the left side of her chest that she describes as tightness without radiation. It occurs at random, with rest or activity, lasts less than a minute and is associated with mild sob, but no nausea, diaphoresis, dizziness, or syncope. No relation to eating.  It has worsened in severity over the last 2-3wks. She is able to walk her dog for about at a time a couple times a day without chest pain or significant sob, however, she does report sob with stairs and moving trash cans to the curb. She states her sob has progressively worsened over the last year. Denies orthopnea, PND, edema, calf pain, extended travel or immobility.  EKG shows NSR 83bpm with no significant ST changes. CXR was without acute cardiopulmonary abnormalities. Labs significant for normal troponin, CKMB 13.3, Total CK 225.    Past Medical History  Diagnosis Date  . Allergy     osteoarthritis  . COPD (chronic obstructive pulmonary disease)   .  GERD (gastroesophageal reflux disease)   . Hypertension   . Thyroid nodule   . TIA (transient ischemic attack)   . Vertigo, intermittent   . Hyperlipidemia   . Osteoarthrosis, unspecified whether generalized or localized, unspecified site   . CAD (coronary artery disease)     cath 06/2004 single-vessel cad w/occlusion of right current artery, mild to moderate mitral regurgitation w/preserved left ventricular systolic function Dr. Melburn Popper ? ventricular tachycardia when she was 42  . Cancer     breast  . Cerebrovascular disease   . Aortic stenosis   . Mitral regurgitation   . Ventricular tachycardia     11/2010 - 2D Echocardiogram Study Conclusions:  - Left ventricle: Wall thickness was increased in a pattern of mild LVH. Systolic function was vigorous. The estimated ejection fraction was in the range of 65% to 70%. Wall motion was normal; there were no regional wall motion abnormalities.  - Aortic valve: There was very mild stenosis. Mild regurgitation. VTI ratio of LVOT to aortic valve: 0.4. Indexed valve area: 0.89cm^2/m^2 (VTI). Peak velocity ratio of LVOT to aortic valve: 0.32. Indexed valve area: 0.72cm^2/m^2 (Vmax). Mean gradient: 8mm Hg (S). Peak gradient: 17mm Hg (S).  - Mitral valve: Calcified annulus. The findings are consistent with mild stenosis. Mild regurgitation.  Echo 2010 - AV: Mean gradient: 9mm Hg (S). Valve area: 1.12cm^2(VTI). Valve area: 1.39cm^2 (Vmax).  08/03/09 - Lexiscan Myoview Overall Impression  Exercise Capacity: Lexiscan  BP Response: Normal blood pressure response.  Clinical Symptoms: SOB  ECG Impression: No significant ST segment change suggestive of  ischemia.  Overall Impression Comments: The tomographic images give the impression of scar at the base of the inferior wall and scar with peri-infarct ischemia at the mid-inferior wall. The wall motion assessment seems to suggest normal motion in all areas. Some of this variation may be due to an unusual shape of  the LV. Regardless, I can not rule out inferior scar with ischemia. Region supplied by occluded RCA; medical therapy  2006 - Cardiac Cath Hemodynamics: LV pressure is 166/11 with an aortic pressure of 164/74.  Angiography: Left main. The left main is fairly normal.  Left anterior descending artery has mild irregularities between 20 and 30%.  The left circumflex artery has mild irregularities. There is a large first obtuse marginal artery that is normal.  The right coronary artery is occluded proximally. It fills via collaterals from left to right as well as from right to right collaterals.  The left ventriculogram was performed in the 30 RAO position. It revealed moderate inferior wall hypokinesis. The overall ejection fraction is normal at about 60%. There is mild to moderate mitral regurgitation.  CONCLUSION:  1. Single-vessel coronary artery disease with an occlusion of the right coronary artery.  2. Intact collateral filling of the right coronary artery.  3. Mild to moderate mitral regurgitation with a well-preserved left ventricular systolic function. Will continue with medical therapy.   Surgical History:  Past Surgical History  Procedure Date  . Cardiac catheterization 06/2004    single-vessel coronary artery disease with occlusion of the right current artery, mild to moderate mitral regurgitation with preserved left ventricular systolic function Dr Elease Hashimoto .  ? Ventricular tachycardia at 42.  . Mastectomy 1967 and 1997  . Joint replacement 2010    total hip  . Tonsillectomy   . Lobectomy 1970     Home Meds: Medication Sig  albuterol (PROVENTIL HFA;VENTOLIN HFA) 108 (90 BASE) MCG/ACT inhaler Inhale 2 puffs into the lungs every 4 (four) hours as needed. For shortness of breath  aspirin 81 MG tablet Take 81 mg by mouth daily.    Cyanocobalamin (VITAMIN B-12 PO) Take 1 tablet by mouth daily.  furosemide (LASIX) 20 MG tablet Take 10 mg by mouth daily.  ipratropium (ATROVENT) 0.06 %  nasal spray Place 2 sprays into the nose 2 (two) times daily.  losartan (COZAAR) 50 MG tablet Take 50 mg by mouth daily.  Nebivolol HCl 20 MG TABS Take 20 mg by mouth daily.  simvastatin (ZOCOR) 40 MG tablet Take 40 mg by mouth at bedtime.  traMADol-acetaminophen (ULTRACET) 37.5-325 MG per tablet Take 1 tablet by mouth daily as needed. For pain  zolpidem (AMBIEN) 10 MG tablet Take 5 mg by mouth at bedtime as needed. For sleep  Cholecalciferol (VITAMIN D3) 10000 UNITS capsule Take 10,000 Units by mouth daily.      Allergies: No Known Allergies  Social History  . Marital Status: Widowed   Occupational History  . retired    Social History Main Topics  . Smoking status: Former Games developer  . Smokeless tobacco: Never Used  . Alcohol Use: 0.0 oz/week   Social History Narrative   RETIREDWIDOW2 SONS 59, 571 DAUGHTER 55FORMER TOBACCO USEETOH YESPT SIGNED DESIGNATED PARTY RELEASE GRANTING ACCESS TO PHI TO DAUGHTER LESLIE DETAILED MESSAGE MAY BE LEFT ON HOME PHONE Donney Rankins 11/01/2009 4:12 PM     Family History  Problem Relation Age of Onset  . Heart disease Mother     CAD  . Lung disease Mother   . Other Father  Cerebral Hemorrhage    Review of Systems: General: negative for chills, fever, night sweats or weight changes.  Cardiovascular: As per HPI  Dermatological: negative for rash Respiratory: negative for cough or wheezing Urologic: negative for hematuria Abdominal: negative for nausea, vomiting, diarrhea, bright red blood per rectum, melena, or hematemesis Neurologic: negative for visual changes, syncope, or dizziness All other systems reviewed and are otherwise negative except as noted above.  Labs:   Component Value Date   WBC 5.3 09/05/2011   HGB 14.6 09/05/2011   HCT 42.9 09/05/2011   MCV 96.2 09/05/2011   PLT 157 09/05/2011    Lab 09/05/11 1130  NA 137  K 4.6  CL 99  CO2 28  BUN 22  CREATININE 0.90  CALCIUM 10.2  GLUCOSE 110*   Basename 09/05/11 1130  CKTOTAL  225*  CKMB 13.3*  TROPONINI <0.30   Radiology/Studies:   09/05/2011 - CXR Findings: Grossly unchanged cardiac silhouette and mediastinal contours with atherosclerotic calcifications within the aortic arch.  Surgical clips overlie the carina.  The lungs remain hyperinflated.  There is chronic elevation of the right hemidiaphragm.  No new focal parenchymal opacities.  No definite pleural effusion or pneumothorax.  Unchanged bones.  IMPRESSION: Chronic emphysematous changes without definite acute cardiopulmonary disease.      EKG: NSR 83bpm with no significant ST changes  Physical Exam: Blood pressure 172/82, pulse 76, temperature 98 F (36.7 C), resp. rate 18, height 5\' 2"  (1.575 m), weight 124 lb (56.246 kg), SpO2 100.00%. General: Pleasant well developed, well nourished, elderly white female in no acute distress. Head: Normocephalic, atraumatic, sclera non-icteric, nares are without discharge Neck: Supple. JVD not elevated. ? Right carotid bruit, but could be radiation from AS murmur Lungs: Clear bilaterally to auscultation without wheezes, rales, or rhonchi. Breathing is unlabored. Chest: Bilat mastectomy scars. Heart: RRR with S1 S2. 2/6 systolic AS murmur with radiation to neck. No rubs or gallops appreciated. Abdomen: Soft, non-tender, non-distended with normoactive bowel sounds. No rebound/guarding. No obvious abdominal masses. Msk:  Strength and tone appear normal for age. Extremities: No edema. No calf pain. No clubbing or cyanosis. Distal pedal pulses are 2+ and equal bilaterally. Neuro: Alert and oriented X 3. Moves all extremities spontaneously. Psych:  Responds to questions appropriately with a normal affect.    ASSESSMENT AND PLAN:  78 y.o. female w/ PMHx significant for breast CA (s/p bilat mastectomy), HLD, HTN, TIA, mild AS/MR, and CAD (by cath '06, medically treated) who presented to [facility] with complaints of chest pain and found to have elevated CKMB and  subsequently transferred to [facility] on 09/05/11.  1. Chest Pain: She has a history of CAD with occluded RCA fed by collaterals and 20-30% mild irregularities of LAD by cath '06. Last Myoview one year ago showed nl EF with scar at the base of the inferior wall and scar with peri-infarct ischemia at the mid-inferior wall. She now presents with worsening of chronic chest discomfort and mild elevation in CKMB, normal troponin, no EKG changes. Symptoms are atypical and don't suspect ACS. Will cycle enzymes, monitor on telemetry. Will check Echo given her history of AS.  See MD note for full assessment and plan    Signed, HOPE, JESSICA PA-C 09/05/2011, 3:16 PM  Patient seen with PA, agree with note.  Known occluded RCA with collaterals from the left.  Long history of atypical chest pain.  Also long history of exertional dyspnea attributed to COPD.  She has been having chest  pain episodes more often recently.  No particular trigger (not exertional).  Episodes last about 5 minutes.  Last episode was actually yesterday.  She was told yesterday by her MD to go to the ER so she went today.  ECG shows no acute changes.  She has had no chest pain or significant dyspnea today.  She feels fine.  Troponin was negative but CK and CKMB were elevated.  We will admit her to check serial enzymes.  I suspect she will rule out with negative troponin given lack of troponin elevation 24 hrs after last chest pain.  She has a murmur suggestive of mild to moderate AS.  - Cycle cardiac enzymes - If cardiac enzymes negative, she can be discharged in am for outpatient myoview.  - Would repeat echo to follow aortic valve, can be done as an outpatient also if she rules out.   Marca Ancona 09/05/2011 4:29 PM

## 2011-09-05 NOTE — ED Notes (Signed)
ckmb 13.3 called from lab-EDP [facility] notified

## 2011-09-05 NOTE — ED Notes (Signed)
Spoke with [facility] on the recall back to the doctor (cardiology) for dr. Brooke Dare

## 2011-09-05 NOTE — ED Provider Notes (Signed)
History     CSN: [id number]  Arrival date & time 09/05/11  1058   First MD Initiated Contact with Patient 09/05/11 1136      Chief Complaint  Patient presents with  . Chest Pain    (Consider location/radiation/quality/duration/timing/severity/associated sxs/prior treatment) Patient is a 78 y.o. female presenting with chest pain. The history is provided by the patient. No language interpreter was used.  Chest Pain Episode onset: several months ago but has gotten more frequent in the past few weeks. Chest pain occurs intermittently. The chest pain is worsening. The pain is associated with exertion. The severity of the pain is mild. The quality of the pain is described as sharp. The pain does not radiate. Chest pain is worsened by exertion. Primary symptoms include shortness of breath. Pertinent negatives for primary symptoms include no fever, no fatigue, no cough, no palpitations, no abdominal pain, no nausea, no vomiting and no dizziness.  Pertinent negatives for associated symptoms include no claudication, no diaphoresis, no numbness and no weakness. She tried nothing for the symptoms. Risk factors include being elderly.  Her past medical history is significant for CAD, hyperlipidemia, hypertension and MI.  Procedure history is positive for cardiac catheterization (2005).     Past Medical History  Diagnosis Date  . Allergy     osteoarthritis  . COPD (chronic obstructive pulmonary disease)   . GERD (gastroesophageal reflux disease)   . Hypertension   . Thyroid nodule   . TIA (transient ischemic attack)   . Vertigo, intermittent   . Hyperlipidemia   . Osteoarthrosis, unspecified whether generalized or localized, unspecified site   . CAD (coronary artery disease)     cath 06/2004 single-vessel cad w/occlusion of right current artery, mild to moderate mitral regurgitation w/preserved left ventricular systolic function Dr. Melburn Popper ? ventricular tachycardia when she was 42  . Cancer    breast  . Cerebrovascular disease   . Aortic stenosis   . Mitral regurgitation   . Ventricular tachycardia     Past Surgical History  Procedure Date  . Cardiac catheterization 06/2004    single-vessel coronary artery disease with occlusion of the right current artery, mild to moderate mitral regurgitation with preserved left ventricular systolic function Dr Elease Hashimoto .  ? Ventricular tachycardia at 42.  . Mastectomy 1967 and 1997  . Joint replacement 2010    total hip  . Tonsillectomy   . Lobectomy 1970    Family History  Problem Relation Age of Onset  . Heart disease Mother     CAD  . Lung disease Mother   . Other Father     Cerebral Hemorrhage    History  Substance Use Topics  . Smoking status: Former Games developer  . Smokeless tobacco: Never Used  . Alcohol Use: 0.0 oz/week    OB History    Grav Para Term Preterm Abortions TAB SAB Ect Mult Living                  Review of Systems  Constitutional: Negative for fever, diaphoresis, activity change, appetite change and fatigue.  HENT: Negative for congestion, sore throat, rhinorrhea, neck pain and neck stiffness.   Respiratory: Positive for shortness of breath. Negative for cough.   Cardiovascular: Positive for chest pain. Negative for palpitations and claudication.  Gastrointestinal: Negative for nausea, vomiting and abdominal pain.  Genitourinary: Negative for dysuria, urgency, frequency and flank pain.  Musculoskeletal: Negative for myalgias, back pain and arthralgias.  Neurological: Negative for dizziness, weakness, light-headedness, numbness and  headaches.  All other systems reviewed and are negative.    Allergies  Review of patient's allergies indicates no known allergies.  Home Medications   Current Outpatient Rx  Name Route Sig Dispense Refill  . ALBUTEROL SULFATE HFA 108 (90 BASE) MCG/ACT IN AERS  2 puffs into the lungs every 4-6 hours as needed 3 Inhaler 3  . ASPIRIN 81 MG PO TABS Oral Take 81 mg by mouth  daily.      . B COMPLEX 1 PO Oral Take by mouth.      Marland Kitchen VITAMIN D3 10000 UNITS PO CAPS Oral Take 10,000 Units by mouth daily.      . FUROSEMIDE 20 MG PO TABS Oral Take 0.5 tablets (10 mg total) by mouth daily. 90 tablet 1  . IPRATROPIUM BROMIDE 0.06 % NA SOLN Nasal Place 2 sprays into the nose 2 (two) times daily. 45 mL 3  . LOSARTAN POTASSIUM 50 MG PO TABS Oral Take 1 tablet (50 mg total) by mouth daily. 90 tablet 3  . NEBIVOLOL HCL 20 MG PO TABS Oral Take 1 tablet (20 mg total) by mouth daily. 90 tablet 1  . SIMVASTATIN 40 MG PO TABS Oral Take 1 tablet (40 mg total) by mouth at bedtime. 90 tablet 1  . TRAMADOL-ACETAMINOPHEN 37.5-325 MG PO TABS Oral Take 1 tablet by mouth daily as needed. 90 tablet 1  . ZOLPIDEM TARTRATE 5 MG PO TABS Oral Take 0.5 tablets (2.5 mg total) by mouth at bedtime as needed for sleep. 30 tablet 1    BP 175/72  Pulse 78  Resp 20  Ht 5\' 2"  (1.575 m)  Wt 121 lb (54.885 kg)  BMI 22.13 kg/m2  SpO2 100%  Physical Exam  Nursing note and vitals reviewed. Constitutional: She is oriented to person, place, and time. She appears well-developed and well-nourished. No distress.  HENT:  Head: Normocephalic and atraumatic.  Mouth/Throat: Oropharynx is clear and moist. No oropharyngeal exudate.  Eyes: Conjunctivae and EOM are normal. Pupils are equal, round, and reactive to light.  Neck: Normal range of motion. Neck supple.  Cardiovascular: Normal rate, regular rhythm, normal heart sounds and intact distal pulses.  Exam reveals no gallop and no friction rub.   No murmur heard. Pulmonary/Chest: Effort normal and breath sounds normal. No respiratory distress. She exhibits no tenderness.  Abdominal: Soft. Bowel sounds are normal. There is no tenderness.  Musculoskeletal: Normal range of motion. She exhibits no edema and no tenderness.  Neurological: She is alert and oriented to person, place, and time. No cranial nerve deficit.  Skin: Skin is warm and dry. No rash noted.     ED Course  Procedures (including critical care time)   Date: 09/05/2011  Rate: 71  Rhythm: normal sinus rhythm  QRS Axis: normal  Intervals: normal  ST/T Wave abnormalities: normal  Conduction Disutrbances:none  Narrative Interpretation:   Old EKG Reviewed: unchanged  Labs Reviewed  BASIC METABOLIC PANEL - Abnormal; Notable for the following:    Glucose, Bld 110 (*)    GFR calc non Af Amer 58 (*)    GFR calc Af Amer 67 (*)    All other components within normal limits  CARDIAC PANEL(CRET KIN+CKTOT+MB+TROPI) - Abnormal; Notable for the following:    Total CK 225 (*)    CK, MB 13.3 (*)    Relative Index 5.9 (*)    All other components within normal limits  CBC  DIFFERENTIAL   Dg Chest 2 View  09/05/2011  *RADIOLOGY REPORT*  Clinical Data: Chest pain, chronic episodes of a left-sided chest pain and shortness of breath  CHEST - 2 VIEW  Comparison: 08/01/2009; 07/10/2008  Findings: Grossly unchanged cardiac silhouette and mediastinal contours with atherosclerotic calcifications within the aortic arch.  Surgical clips overlie the carina.  The lungs remain hyperinflated.  There is chronic elevation of the right hemidiaphragm.  No new focal parenchymal opacities.  No definite pleural effusion or pneumothorax.  Unchanged bones.  IMPRESSION: Chronic emphysematous changes without definite acute cardiopulmonary disease.  Original Report Authenticated By: Waynard Reeds, M.D.     1. Chest pain   2. Elevated CK-MB level       MDM  Chest pain with elevated CK-MB level. Troponins negative. Chest x-ray with emphysematous changes otherwise unremarkable. She received aspirin in the emergency department and remained chest pain-free. Discussed with the cardiologist on call who recommended she be transferred to [facility] for admission. They'll watch her over the weekend and perhaps perform a catheterization. There is no indication for heparin at this time. Additional etiologies of chest pain  were considered however cardiac etiology considered most likely this time         Dayton Bailiff, MD 09/05/11 1243

## 2011-09-05 NOTE — Telephone Encounter (Signed)
She just got here. Will change. cdm

## 2011-09-05 NOTE — ED Notes (Addendum)
[facility] here for transport-pt denies CP

## 2011-09-05 NOTE — Telephone Encounter (Signed)
New Msg: [employer] calling wanting to speak with nurse/MD regarding pt. Caller is requesting to get observation on pt as oppose to inpatient admission. Please return call to discuss further today if possible.

## 2011-09-05 NOTE — Telephone Encounter (Signed)
[employer] is requesting that her hospital status be changed from inpatient to observation until she has been diagnosed with an mi or has a cath, etc.  She is requesting to speak with Dr Clifton James.

## 2011-09-05 NOTE — ED Notes (Signed)
Patient states that she normally has discomfort in her chest but has increased over last 2 weeks.

## 2011-09-06 ENCOUNTER — Other Ambulatory Visit: Payer: Self-pay

## 2011-09-06 LAB — CARDIAC PANEL(CRET KIN+CKTOT+MB+TROPI)
Relative Index: 5 — ABNORMAL HIGH (ref 0.0–2.5)
Total CK: 149 U/L (ref 7–177)

## 2011-09-06 LAB — BASIC METABOLIC PANEL
BUN: 18 mg/dL (ref 6–23)
CO2: 27 mEq/L (ref 19–32)
Calcium: 9.6 mg/dL (ref 8.4–10.5)
Creatinine, Ser: 0.88 mg/dL (ref 0.50–1.10)
GFR calc non Af Amer: 59 mL/min — ABNORMAL LOW (ref >90)
Glucose, Bld: 94 mg/dL (ref 70–99)
Sodium: 138 mEq/L (ref 135–145)

## 2011-09-06 LAB — CBC
HCT: 42.6 % (ref 36.0–46.0)
Hemoglobin: 14.2 g/dL (ref 12.0–15.0)
MCH: 32.4 pg (ref 26.0–34.0)
MCHC: 33.3 g/dL (ref 30.0–36.0)
MCV: 97.3 fL (ref 78.0–100.0)
RBC: 4.38 MIL/uL (ref 3.87–5.11)

## 2011-09-06 MED ORDER — NITROGLYCERIN 0.4 MG SL SUBL: 0.4000 mg | SUBLINGUAL_TABLET | SUBLINGUAL | Status: AC | PRN

## 2011-09-06 MED ORDER — LOSARTAN POTASSIUM 50 MG PO TABS: 50.0000 mg | ORAL_TABLET | Freq: Two times a day (BID) | ORAL | Status: DC

## 2011-09-06 NOTE — Plan of Care (Signed)
Problem: Phase II Progression Outcomes Goal: Stress Test if indicated Outcome: Not Met (add Reason) To be done outpatient  Comments:  myoview to be performed out[patient as well as a 2d echo

## 2011-09-06 NOTE — Discharge Summary (Signed)
CARDIOLOGY DISCHARGE SUMMARY   Patient ID: Janet Allison MRN: [id number] DOB/AGE: 08-Apr-1926 78 y.o.  Admit date: 09/05/2011 Discharge date: 09/06/2011  Primary Discharge Diagnosis:  Chest pain, Med Rx for angina  Secondary Discharge Diagnosis:  Patient Active Problem List  Diagnoses  . THYROID DISORDER  . HYPERLIPIDEMIA  . HYPONATREMIA  . HYPERKALEMIA  . HYPERTENSION  . CORONARY ARTERY DISEASE  . AORTIC VALVE DISORDERS  . TIA  . CEREBROVASCULAR DISEASE  . ALLERGIC RHINITIS  . COPD  . GERD  . OSTEOARTHRITIS  . INTERMITTENT VERTIGO  . DYSPNEA ON EXERTION  . CHEST PAIN  . BREAST CANCER, HX OF  . SHOULDER PAIN, RIGHT  . Memory loss  . Gait instability  . Chronic insomnia  . Fatigue  . Dizziness   Procedures: Two-view chest x-ray  Hospital Course: Janet Allison is an 78 year old female with known coronary artery disease. She had chest pain he came to the hospital where she was admitted for further evaluation and treatment.  She had some elevation in her CK-MB is but all troponins were negative. She had no further chest pain. Her other labs contained no significant abnormalities. On 09/06/2011, she was seen by Dr. Graciela Husbands. He noted that her blood pressure was elevated and increased her ARB to twice a day. He felt that she was stable for discharge on 09/06/2011, with outpatient followup, echocardiogram and carotid Dopplers to be arranged.   Labs:   Lab Results  Component Value Date   WBC 4.5 09/06/2011   HGB 14.2 09/06/2011   HCT 42.6 09/06/2011   MCV 97.3 09/06/2011   PLT 150 09/06/2011    Lab 09/06/11 0600  NA 138  K 4.5  CL 101  CO2 27  BUN 18  CREATININE 0.88  CALCIUM 9.6  PROT --  BILITOT --  ALKPHOS --  ALT --  AST --  GLUCOSE 94    Basename 09/06/11 0600 09/05/11 2234 09/05/11 1648  CKTOTAL 149 167 188*  CKMB 7.5* 8.2* 9.7*  CKMBINDEX -- -- --  TROPONINI <0.30 <0.30 <0.30   Lipid Panel     Component Value Date/Time   CHOL 187 05/13/2011 1131   TRIG 81  05/13/2011 1131   HDL 84 05/13/2011 1131   CHOLHDL 2.2 05/13/2011 1131   VLDL 16 05/13/2011 1131   LDLCALC 87 05/13/2011 1131     Radiology: Dg Chest 2 View  09/05/2011  *RADIOLOGY REPORT*  Clinical Data: Chest pain, chronic episodes of a left-sided chest pain and shortness of breath  CHEST - 2 VIEW  Comparison: 08/01/2009; 07/10/2008  Findings: Grossly unchanged cardiac silhouette and mediastinal contours with atherosclerotic calcifications within the aortic arch.  Surgical clips overlie the carina.  The lungs remain hyperinflated.  There is chronic elevation of the right hemidiaphragm.  No new focal parenchymal opacities.  No definite pleural effusion or pneumothorax.  Unchanged bones.  IMPRESSION: Chronic emphysematous changes without definite acute cardiopulmonary disease.  Original Report Authenticated By: Waynard Reeds, M.D.    EKG: 06-Sep-2011 05:08:26 [facility]- ROUTINE RECORD Normal sinus rhythm Possible Left atrial enlargement Possible Anterior infarct , age undetermined Abnormal ECG 82mm/s 69mm/mV 100Hz  7.1.1 12SL 237 CID: [id number] Referred by: Verne Carrow Newly Acquired Vent. rate 69 BPM PR interval 172 ms QRS duration 86 ms QT/QTc 422/452 ms P-R-T axes 82 3 65   FOLLOW UP PLANS AND APPOINTMENTS Discharge Orders    Future Appointments: Provider: Department: Dept Phone: Center:   11/03/2011 11:30 AM Acie Fredrickson  [place], MD [facility] [phone] [facility]     Future Orders Please Complete By Expires   Diet - low sodium heart healthy      Increase activity slowly        No Known Allergies Medication List  As of 09/06/2011 12:00 PM   TAKE these medications         albuterol 108 (90 BASE) MCG/ACT inhaler   Commonly known as: PROVENTIL HFA;VENTOLIN HFA   Inhale 2 puffs into the lungs every 4 (four) hours as needed. For shortness of breath      aspirin 81 MG tablet   Take 81 mg by mouth daily.      furosemide 20 MG tablet   Commonly known as:  LASIX   Take 10 mg by mouth daily.      ipratropium 0.06 % nasal spray   Commonly known as: ATROVENT   Place 2 sprays into the nose 2 (two) times daily.      losartan 50 MG tablet   Commonly known as: COZAAR   Take 50 mg by mouth daily.      Nebivolol HCl 20 MG Tabs   Take 20 mg by mouth daily.      nitroGLYCERIN 0.4 MG SL tablet   Commonly known as: NITROSTAT   Place 1 tablet (0.4 mg total) under the tongue every 5 (five) minutes x 3 doses as needed for chest pain.      simvastatin 40 MG tablet   Commonly known as: ZOCOR   Take 40 mg by mouth at bedtime.      traMADol-acetaminophen 37.5-325 MG per tablet   Commonly known as: ULTRACET   Take 1 tablet by mouth daily as needed. For pain      VITAMIN B-12 PO   Take 1 tablet by mouth daily.      zolpidem 10 MG tablet   Commonly known as: AMBIEN   Take 5 mg by mouth at bedtime as needed. For sleep           Follow-up Information    Follow up with Letitia Libra, Ala Dach, MD. (As needed)       Follow up with Olga Millers, MD. (Keep appt in May)    Contact information:   [address] [place], [address] [place] [address] [phone]          BRING ALL MEDICATIONS WITH YOU TO FOLLOW UP APPOINTMENTS  Time spent with patient to include physician time: 34 min  Signed: Theodore Demark 09/06/2011, 12:00 PM Co-Sign MD

## 2011-09-06 NOTE — Progress Notes (Signed)
Patient Name: Janet Allison      SUBJECTIVE: Admitted yesterday with chest pain that was thought to be atypical in the context of known occluded RCA with left-right collaterals. She is found to have an elevated CK-MB and troponins were normal   Shows a complex past history notable for bilateral mastectomy for breast cancer, prior TIA, AI /MR.  Past Medical History  Diagnosis Date  . Allergy     osteoarthritis  . COPD (chronic obstructive pulmonary disease)   . GERD (gastroesophageal reflux disease)   . Hypertension   . Thyroid nodule   . TIA (transient ischemic attack)   . Vertigo, intermittent   . Hyperlipidemia   . Osteoarthrosis, unspecified whether generalized or localized, unspecified site   . CAD (coronary artery disease)     cath 06/2004 single-vessel cad w/occlusion of right current artery, mild to moderate mitral regurgitation w/preserved left ventricular systolic function Dr. Melburn Popper ? ventricular tachycardia when she was 42  . Cancer     breast  . Cerebrovascular disease   . Aortic stenosis   . Mitral regurgitation   . Ventricular tachycardia   . Stroke     HX OF TIA  . Shortness of breath     PHYSICAL EXAM Filed Vitals:   09/05/11 1436 09/05/11 2100 09/06/11 0500 09/06/11 0658  BP: 172/82 165/76 180/99 158/62  Pulse: 76 72 73   Temp: 98 F (36.7 C) 97.9 F (36.6 C) 98.6 F (37 C)   TempSrc:  Oral Oral   Resp: 18 18 18    Height: 5\' 2"  (1.575 m)     Weight: 124 lb (56.246 kg)  118 lb 11.2 oz (53.842 kg)   SpO2: 100% 96% 98%     General appearance: alert, cooperative and no distress Neck: no JVD and no sign delay Lungs: clear to auscultation bilaterally Heart: regular rate and rhythm, S1: normal, S2: rertained split and systolic murmur: systolic ejection 3/6, crescendo and decrescendo at 2nd left intercostal space Abdomen: soft, non-tender; bowel sounds normal; no masses,  no organomegaly Extremities: extremities normal, atraumatic, no cyanosis or  edema Pulses: 2+ and symmetric Skin: Skin color, texture, turgor normal. No rashes or lesions Neurologic: Alert and oriented X 3, normal strength and tone. Normal symmetric reflexes. Normal coordination and gait  TELEMETRY: Reviewed telemetry pt in nsr:    Intake/Output Summary (Last 24 hours) at 09/06/11 0939 Last data filed at 09/06/11 0820  Gross per 24 hour  Intake    840 ml  Output      0 ml  Net    840 ml    LABS: Basic Metabolic Panel:  Lab 09/06/11 1610 09/05/11 1648 09/05/11 1130  NA 138 -- 137  K 4.5 -- 4.6  CL 101 -- 99  CO2 27 -- 28  GLUCOSE 94 -- 110*  BUN 18 -- 22  CREATININE 0.88 0.82 0.90  CALCIUM 9.6 -- 10.2  MG -- -- --  PHOS -- -- --   Cardiac Enzymes:  Basename 09/06/11 0600 09/05/11 2234 09/05/11 1648  CKTOTAL 149 167 188*  CKMB 7.5* 8.2* 9.7*  CKMBINDEX -- -- --  TROPONINI <0.30 <0.30 <0.30   CBC:  Lab 09/06/11 0600 09/05/11 1648 09/05/11 1130  WBC 4.5 5.1 5.3  NEUTROABS -- -- 3.3  HGB 14.2 14.0 14.6  HCT 42.6 41.8 42.9  MCV 97.3 96.5 96.2  PLT 150 164 157      ASSESSMENT AND PLAN:  Patient Active Hospital Problem List: HYPERTENSION (10/23/2008)  CHEST PAIN (07/25/2009)  patient admitted with chest pain it appears to be noncardiac. Will plan discharge later today.  Her blood pressure, particularly at night and is extremely high. Her losartan dose was recently decreased from 100-50 because of daytime fatigue and modest/relative hypotension with a blood pressure systolic of 110. I will resume her losartan 50 twice a day.  She was supposed to see Dr. Jens Som in May. We'll arrange followup as set up at last visit. She needs an echo to look at her aortic valve and carotid Dopplers in anticipation of that visit.   Signed, Sherryl Manges MD  09/06/2011

## 2011-09-10 ENCOUNTER — Other Ambulatory Visit: Payer: Self-pay | Admitting: Cardiology

## 2011-09-12 ENCOUNTER — Other Ambulatory Visit: Payer: Self-pay | Admitting: Cardiology

## 2011-09-12 DIAGNOSIS — I6529 Occlusion and stenosis of unspecified carotid artery: Secondary | ICD-10-CM

## 2011-09-15 ENCOUNTER — Encounter (INDEPENDENT_AMBULATORY_CARE_PROVIDER_SITE_OTHER): Payer: Medicare Other

## 2011-09-15 DIAGNOSIS — I6529 Occlusion and stenosis of unspecified carotid artery: Secondary | ICD-10-CM

## 2011-09-15 DIAGNOSIS — R42 Dizziness and giddiness: Secondary | ICD-10-CM

## 2011-09-17 ENCOUNTER — Encounter: Payer: Self-pay | Admitting: Internal Medicine

## 2011-09-17 ENCOUNTER — Ambulatory Visit (INDEPENDENT_AMBULATORY_CARE_PROVIDER_SITE_OTHER): Payer: Medicare Other | Admitting: Internal Medicine

## 2011-09-17 VITALS — BP 124/60 | HR 88 | Temp 97.6°F | Ht 63.0 in | Wt 126.0 lb

## 2011-09-17 DIAGNOSIS — G4700 Insomnia, unspecified: Secondary | ICD-10-CM

## 2011-09-17 DIAGNOSIS — F5104 Psychophysiologic insomnia: Secondary | ICD-10-CM

## 2011-09-17 DIAGNOSIS — J449 Chronic obstructive pulmonary disease, unspecified: Secondary | ICD-10-CM

## 2011-09-17 MED ORDER — BUDESONIDE-FORMOTEROL FUMARATE 80-4.5 MCG/ACT IN AERO: 2.0000 | INHALATION_SPRAY | Freq: Two times a day (BID) | RESPIRATORY_TRACT | Status: DC

## 2011-09-21 NOTE — Assessment & Plan Note (Signed)
Attempt symbicort. Sample and prescription given. Rinse mouth afterwards. Followup if no improvement or worsening.

## 2011-09-21 NOTE — Progress Notes (Signed)
  Subjective:    Patient ID: Janet Allison, female    DOB: 1926/05/12, 78 y.o.   MRN: [id number]  HPI Pt presents to clinic for followup of multiple medical problems. Recently hospitalized and bp was elevated. Believes it may have been temporally related to [place]. States bp elevations were at night. is weaning ambien off. Has DOE with echo/carotid pending as well as cardiology f/u. Does have h/o copd and albuterol mdi does help. No other complaints.  Past Medical History  Diagnosis Date  . Allergy     osteoarthritis  . COPD (chronic obstructive pulmonary disease)   . GERD (gastroesophageal reflux disease)   . Hypertension   . Thyroid nodule   . TIA (transient ischemic attack)   . Vertigo, intermittent   . Hyperlipidemia   . Osteoarthrosis, unspecified whether generalized or localized, unspecified site   . CAD (coronary artery disease)     cath 06/2004 single-vessel cad w/occlusion of right current artery, mild to moderate mitral regurgitation w/preserved left ventricular systolic function Dr. Melburn Popper ? ventricular tachycardia when she was 42  . Cancer     breast  . Cerebrovascular disease   . Aortic stenosis   . Mitral regurgitation   . Ventricular tachycardia   . Stroke     HX OF TIA  . Shortness of breath    Past Surgical History  Procedure Date  . Cardiac catheterization 06/2004    single-vessel coronary artery disease with occlusion of the right current artery, mild to moderate mitral regurgitation with preserved left ventricular systolic function Dr Elease Hashimoto .  ? Ventricular tachycardia at 42.  . Mastectomy 1967 and 1997  . Joint replacement 2010    total hip  . Tonsillectomy   . Lobectomy 1970  . Breast surgery     MASTECTOMY    reports that she has quit smoking. She has never used smokeless tobacco. She reports that she drinks about 1.2 ounces of alcohol per week. She reports that she does not use illicit drugs. family history includes Heart disease in her mother; Lung  disease in her mother; and Other in her father. No Known Allergies    Review of Systems see hpi     Objective:   Physical Exam  Physical Exam  Nursing note and vitals reviewed. Constitutional: Appears well-developed and well-nourished. No distress.  HENT:  Head: Normocephalic and atraumatic.  Right Ear: External ear normal.  Left Ear: External ear normal.  Eyes: Conjunctivae are normal. No scleral icterus.  Neck: Neck supple. Carotid bruit is not present.  Cardiovascular: Normal rate, regular rhythm and normal heart sounds.  Exam reveals no gallop and no friction rub.   No murmur heard. Pulmonary/Chest: Effort normal and breath sounds normal. No respiratory distress. He has no wheezes. no rales.  Lymphadenopathy:    He has no cervical adenopathy.  Neurological:Alert.  Skin: Skin is warm and dry. Not diaphoretic.  Psychiatric: Has a normal mood and affect.        Assessment & Plan:

## 2011-09-21 NOTE — Assessment & Plan Note (Signed)
Wean off ambien. Attempt melatonin prn

## 2011-09-26 ENCOUNTER — Other Ambulatory Visit (HOSPITAL_COMMUNITY): Payer: Self-pay | Admitting: Radiology

## 2011-09-26 DIAGNOSIS — I359 Nonrheumatic aortic valve disorder, unspecified: Secondary | ICD-10-CM

## 2011-09-29 ENCOUNTER — Other Ambulatory Visit: Payer: Self-pay

## 2011-09-29 ENCOUNTER — Ambulatory Visit (HOSPITAL_COMMUNITY): Payer: Medicare Other | Attending: Cardiology

## 2011-09-29 DIAGNOSIS — I2510 Atherosclerotic heart disease of native coronary artery without angina pectoris: Secondary | ICD-10-CM | POA: Insufficient documentation

## 2011-09-29 DIAGNOSIS — C50919 Malignant neoplasm of unspecified site of unspecified female breast: Secondary | ICD-10-CM | POA: Insufficient documentation

## 2011-09-29 DIAGNOSIS — I472 Ventricular tachycardia, unspecified: Secondary | ICD-10-CM | POA: Insufficient documentation

## 2011-09-29 DIAGNOSIS — Z8673 Personal history of transient ischemic attack (TIA), and cerebral infarction without residual deficits: Secondary | ICD-10-CM | POA: Insufficient documentation

## 2011-09-29 DIAGNOSIS — I359 Nonrheumatic aortic valve disorder, unspecified: Secondary | ICD-10-CM | POA: Insufficient documentation

## 2011-09-29 DIAGNOSIS — I10 Essential (primary) hypertension: Secondary | ICD-10-CM | POA: Insufficient documentation

## 2011-09-29 DIAGNOSIS — J4489 Other specified chronic obstructive pulmonary disease: Secondary | ICD-10-CM | POA: Insufficient documentation

## 2011-09-29 DIAGNOSIS — I519 Heart disease, unspecified: Secondary | ICD-10-CM | POA: Insufficient documentation

## 2011-09-29 DIAGNOSIS — E785 Hyperlipidemia, unspecified: Secondary | ICD-10-CM | POA: Insufficient documentation

## 2011-09-29 DIAGNOSIS — I679 Cerebrovascular disease, unspecified: Secondary | ICD-10-CM | POA: Insufficient documentation

## 2011-09-29 DIAGNOSIS — I4729 Other ventricular tachycardia: Secondary | ICD-10-CM | POA: Insufficient documentation

## 2011-09-29 DIAGNOSIS — J449 Chronic obstructive pulmonary disease, unspecified: Secondary | ICD-10-CM | POA: Insufficient documentation

## 2011-09-29 DIAGNOSIS — I059 Rheumatic mitral valve disease, unspecified: Secondary | ICD-10-CM | POA: Insufficient documentation

## 2011-10-03 ENCOUNTER — Other Ambulatory Visit: Payer: Self-pay | Admitting: *Deleted

## 2011-10-03 MED ORDER — LOSARTAN POTASSIUM 50 MG PO TABS: 50.0000 mg | ORAL_TABLET | Freq: Two times a day (BID) | ORAL | Status: DC

## 2011-10-03 NOTE — Telephone Encounter (Signed)
Patient called and left voice message requesting a refill on Losartan. Her message stated the Losartan was increased to 2 tablets a day. She is requesting the refill to [facility] mail order.  Rx sent.

## 2011-10-07 ENCOUNTER — Encounter: Payer: Self-pay | Admitting: Cardiology

## 2011-10-07 ENCOUNTER — Ambulatory Visit (INDEPENDENT_AMBULATORY_CARE_PROVIDER_SITE_OTHER): Payer: Medicare Other | Admitting: Cardiology

## 2011-10-07 VITALS — BP 139/70 | HR 86 | Wt 125.0 lb

## 2011-10-07 DIAGNOSIS — I2510 Atherosclerotic heart disease of native coronary artery without angina pectoris: Secondary | ICD-10-CM

## 2011-10-07 DIAGNOSIS — I679 Cerebrovascular disease, unspecified: Secondary | ICD-10-CM

## 2011-10-07 DIAGNOSIS — I359 Nonrheumatic aortic valve disorder, unspecified: Secondary | ICD-10-CM

## 2011-10-07 DIAGNOSIS — I10 Essential (primary) hypertension: Secondary | ICD-10-CM

## 2011-10-07 DIAGNOSIS — E785 Hyperlipidemia, unspecified: Secondary | ICD-10-CM

## 2011-10-07 DIAGNOSIS — R079 Chest pain, unspecified: Secondary | ICD-10-CM

## 2011-10-07 NOTE — Assessment & Plan Note (Signed)
Plan repeat echocardiograms in the future.

## 2011-10-07 NOTE — Progress Notes (Signed)
HPI: Pleasant female for FU of coronary artery disease. Cardiac catheterization in 2006 revealed a 20-30% LAD and an occluded right coronary artery. There were left to right collaterals. Ejection fraction was 60% with inferior hypokinesis. She is treated medically. Myoview in March of 2011 revealed EF 75% and inferior scar/ischemia could not be excluded. Medical therapy as area supplied by occluded RCA.  Admitted in April of 2013 with what was felt to be noncardiac chest pain. Troponins normal. Last echocardiogram in April 2013 showed normal LV function. There was a mid-cavitary gradient of 2.8 m/s. There was mild aortic stenosis with a mean gradient of 13 mm of mercury. There was mild mitral stenosis with a mean gradient of 6 mm of mercury and mild mitral regurgitation. Carotid Dopplers in April 2013 showed 0-39% bilateral stenosis. Followup is recommended in 1 year. Since she was [facility], she notes some dyspnea on exertion attributed to COPD. No chest pain, palpitations or syncope. Her pain at the time of admission has been intermittent since she was 78 years old by her report. She does not have exertional chest pain.   Current Outpatient Prescriptions  Medication Sig Dispense Refill  . albuterol (PROVENTIL HFA;VENTOLIN HFA) 108 (90 BASE) MCG/ACT inhaler Inhale 2 puffs into the lungs every 4 (four) hours as needed. For shortness of breath      . aspirin 81 MG tablet Take 81 mg by mouth daily.        . budesonide-formoterol (SYMBICORT) 80-4.5 MCG/ACT inhaler Inhale 2 puffs into the lungs 2 (two) times daily. Rinse mouth after use  1 Inhaler  3  . furosemide (LASIX) 20 MG tablet Take 10 mg by mouth daily.      Marland Kitchen ipratropium (ATROVENT) 0.06 % nasal spray Place 2 sprays into the nose 2 (two) times daily.      Marland Kitchen losartan (COZAAR) 50 MG tablet Take 1 tablet (50 mg total) by mouth 2 (two) times daily.  180 tablet  2  . Nebivolol HCl 20 MG TABS Take 20 mg by mouth daily.      . nitroGLYCERIN (NITROSTAT) 0.4  MG SL tablet Place 1 tablet (0.4 mg total) under the tongue every 5 (five) minutes x 3 doses as needed for chest pain.  25 tablet  3  . simvastatin (ZOCOR) 40 MG tablet Take 40 mg by mouth at bedtime.      . traMADol-acetaminophen (ULTRACET) 37.5-325 MG per tablet Take 1 tablet by mouth daily as needed. For pain      . zolpidem (AMBIEN) 10 MG tablet Take 5 mg by mouth at bedtime as needed. For sleep         Past Medical History  Diagnosis Date  . Allergy     osteoarthritis  . COPD (chronic obstructive pulmonary disease)   . GERD (gastroesophageal reflux disease)   . Hypertension   . Thyroid nodule   . TIA (transient ischemic attack)   . Vertigo, intermittent   . Hyperlipidemia   . Osteoarthrosis, unspecified whether generalized or localized, unspecified site   . CAD (coronary artery disease)     cath 06/2004 single-vessel cad w/occlusion of right current artery, mild to moderate mitral regurgitation w/preserved left ventricular systolic function Dr. Melburn Popper ? ventricular tachycardia when she was 42  . Cancer     breast  . Cerebrovascular disease   . Aortic stenosis   . Mitral regurgitation   . Ventricular tachycardia   . Stroke     HX OF TIA  . Shortness  of breath     Past Surgical History  Procedure Date  . Cardiac catheterization 06/2004    single-vessel coronary artery disease with occlusion of the right current artery, mild to moderate mitral regurgitation with preserved left ventricular systolic function Dr Elease Hashimoto .  ? Ventricular tachycardia at 42.  . Mastectomy 1967 and 1997  . Joint replacement 2010    total hip  . Tonsillectomy   . Lobectomy 1970  . Breast surgery     MASTECTOMY    History   Social History  . Marital Status: Widowed    Spouse Name: N/A    Number of Children: N/A  . Years of Education: N/A   Occupational History  . retired    Social History Main Topics  . Smoking status: Former Games developer  . Smokeless tobacco: Never Used  . Alcohol Use: 1.2  oz/week    2 Glasses of wine per week  . Drug Use: No  . Sexually Active: Not Currently    Birth Control/ Protection: Post-menopausal   Other Topics Concern  . Not on file   Social History Narrative   RETIREDWIDOW2 SONS 59, 571 DAUGHTER 57FORMER TOBACCO USEETOH YESPT SIGNED DESIGNATED PARTY RELEASE GRANTING ACCESS TO PHI TO DAUGHTER LESLIE DETAILED MESSAGE MAY BE LEFT ON HOME PHONE [facility] 11/01/2009 4:12 PM    ROS: no fevers or chills, productive cough, hemoptysis, dysphasia, odynophagia, melena, hematochezia, dysuria, hematuria, rash, seizure activity, orthopnea, PND, pedal edema, claudication. Remaining systems are negative.  Physical Exam: Well-developed well-nourished in no acute distress.  Skin is warm and dry.  HEENT is normal.  Neck is supple.  Chest is clear to auscultation with normal expansion.  Cardiovascular exam is regular rate and rhythm. 2/6 systolic murmur left sternal border. Abdominal exam nontender or distended. No masses palpated. Extremities show no edema. neuro grossly intact

## 2011-10-07 NOTE — Assessment & Plan Note (Signed)
Continue aspirin and statin. Followup carotid Dopplers April 2014. 

## 2011-10-07 NOTE — Assessment & Plan Note (Signed)
Continue aspirin and statin. Followup Myoview when she returns in one year.

## 2011-10-07 NOTE — Assessment & Plan Note (Signed)
Continue statin. 

## 2011-10-07 NOTE — Patient Instructions (Signed)
Your physician wants you to follow-up in: ONE YEAR WITH DR CRENSHAW You will receive a reminder letter in the mail two months in advance. If you don't receive a letter, please call our office to schedule the follow-up appointment.  

## 2011-10-07 NOTE — Assessment & Plan Note (Signed)
Symptoms were extremely atypical and chronic. Enzymes were negative. No plans for further cardiac evaluation at this time.

## 2011-10-07 NOTE — Assessment & Plan Note (Signed)
Blood pressure controlled. Continue present medications. 

## 2011-11-03 ENCOUNTER — Ambulatory Visit: Payer: Medicare Other | Admitting: Internal Medicine

## 2011-11-04 ENCOUNTER — Encounter: Payer: Self-pay | Admitting: Cardiovascular Disease

## 2011-11-24 ENCOUNTER — Other Ambulatory Visit: Payer: Self-pay | Admitting: *Deleted

## 2011-11-24 MED ORDER — ZOLPIDEM TARTRATE 10 MG PO TABS: 5.0000 mg | ORAL_TABLET | Freq: Every evening | ORAL | Status: DC | PRN

## 2011-11-24 NOTE — Telephone Encounter (Signed)
Patient called and left voice message requesting refill on Zolpidem, she stated that she has tried to wean off of the medication, but has found that she has not been able to sleep. If approved she is requesting refill to [facility] for a 30 day supply.

## 2011-11-24 NOTE — Telephone Encounter (Signed)
She had indicated she thought elevated bp was due to [place]. If she believes not now then ambien 5mg  po qhs prn #30 rf2

## 2011-11-24 NOTE — Telephone Encounter (Signed)
Call placed to patient she stated that she found the Ambien was not the cause of the elevation in her blood pressure. She was informed Rx has been faxed to pharmacy. No further action is required.

## 2011-11-28 ENCOUNTER — Other Ambulatory Visit: Payer: Self-pay | Admitting: *Deleted

## 2011-11-28 DIAGNOSIS — E079 Disorder of thyroid, unspecified: Secondary | ICD-10-CM

## 2011-11-28 DIAGNOSIS — E785 Hyperlipidemia, unspecified: Secondary | ICD-10-CM

## 2011-11-28 DIAGNOSIS — Z79899 Other long term (current) drug therapy: Secondary | ICD-10-CM

## 2011-11-29 LAB — BASIC METABOLIC PANEL
BUN: 20 mg/dL (ref 6–23)
Creat: 0.97 mg/dL (ref 0.50–1.10)
Glucose, Bld: 89 mg/dL (ref 70–99)
Potassium: 4.8 mEq/L (ref 3.5–5.3)

## 2011-11-29 LAB — T4, FREE: Free T4: 1.39 ng/dL (ref 0.80–1.80)

## 2011-11-29 LAB — HEPATIC FUNCTION PANEL
AST: 24 U/L (ref 0–37)
Albumin: 4.7 g/dL (ref 3.5–5.2)
Alkaline Phosphatase: 60 U/L (ref 39–117)
Bilirubin, Direct: 0.2 mg/dL (ref 0.0–0.3)
Total Bilirubin: 1 mg/dL (ref 0.3–1.2)

## 2011-11-29 LAB — LIPID PANEL
HDL: 77 mg/dL (ref >39)
LDL Cholesterol: 98 mg/dL (ref 0–99)

## 2011-11-29 LAB — TSH: TSH: 1.193 u[IU]/mL (ref 0.350–4.500)

## 2011-12-01 ENCOUNTER — Telehealth: Payer: Self-pay | Admitting: *Deleted

## 2011-12-01 DIAGNOSIS — Z79899 Other long term (current) drug therapy: Secondary | ICD-10-CM

## 2011-12-01 NOTE — Telephone Encounter (Signed)
Looks like she cancelled June appt. Needs f/u within the next 2 months

## 2011-12-01 NOTE — Telephone Encounter (Signed)
Notes Recorded by Edwyna Perfect, MD on 11/30/2011 at 7:50 PM Labs ok. Needs chem7 v58.69 with next visit labs  Lab order placed [no future OV scheduled]; patient informed & states that she was not to have a follow-up OV if labs were w/i normal limits/SLS Please advise.

## 2011-12-02 NOTE — Telephone Encounter (Signed)
[facility] w/contact name & number for patient to return call & schedule F/U appointment per Dr. Chapman Moss

## 2011-12-22 ENCOUNTER — Other Ambulatory Visit: Payer: Self-pay | Admitting: *Deleted

## 2011-12-22 MED ORDER — FUROSEMIDE 20 MG PO TABS: 10.0000 mg | ORAL_TABLET | Freq: Every day | ORAL | Status: DC

## 2011-12-22 NOTE — Telephone Encounter (Signed)
Rx Done/SLS 

## 2012-01-27 ENCOUNTER — Ambulatory Visit (INDEPENDENT_AMBULATORY_CARE_PROVIDER_SITE_OTHER): Payer: Medicare Other | Admitting: Internal Medicine

## 2012-01-27 ENCOUNTER — Encounter: Payer: Self-pay | Admitting: Internal Medicine

## 2012-01-27 VITALS — BP 118/64 | HR 76 | Temp 97.7°F | Resp 18 | Wt 123.2 lb

## 2012-01-27 DIAGNOSIS — T148XXA Other injury of unspecified body region, initial encounter: Secondary | ICD-10-CM

## 2012-01-27 DIAGNOSIS — W57XXXA Bitten or stung by nonvenomous insect and other nonvenomous arthropods, initial encounter: Secondary | ICD-10-CM

## 2012-01-27 DIAGNOSIS — J449 Chronic obstructive pulmonary disease, unspecified: Secondary | ICD-10-CM

## 2012-01-27 DIAGNOSIS — T148 Other injury of unspecified body region: Secondary | ICD-10-CM

## 2012-01-27 MED ORDER — METHYLPREDNISOLONE ACETATE 80 MG/ML IJ SUSP
20.0000 mg | Freq: Once | INTRAMUSCULAR | Status: AC
  Administered 2012-01-27: 20 mg via INTRAMUSCULAR

## 2012-01-27 MED ORDER — PREDNISONE 20 MG PO TABS: 20.0000 mg | ORAL_TABLET | Freq: Every day | ORAL | Status: AC

## 2012-01-27 NOTE — Assessment & Plan Note (Signed)
Administer depomedrol IM and begin 5 days of prednisone. Followup if no improvement or worsening.

## 2012-01-27 NOTE — Progress Notes (Signed)
  Subjective:    Patient ID: Janet Allison, female    DOB: [birth year], 78 y.o.   MRN: [id number]  HPI Pt presents to clinic for evaluation of insect bite. C/o insect bite above left eye occurred 2 days ago. Has noted mild to moderate soft tissue swelling above eyebrow and periorbital. No change in visual acuity of EOM. No drainage from area and is not spreading. States is improved from this morning. Also notes continued DOE related to COPD despite addition of symbicort. May have improved sx's mildly. Also uses albuterol mdi prn.   Past Medical History  Diagnosis Date  . Allergy     osteoarthritis  . COPD (chronic obstructive pulmonary disease)   . GERD (gastroesophageal reflux disease)   . Hypertension   . Thyroid nodule   . TIA (transient ischemic attack)   . Vertigo, intermittent   . Hyperlipidemia   . Osteoarthrosis, unspecified whether generalized or localized, unspecified site   . CAD (coronary artery disease)     cath 06/2004 single-vessel cad w/occlusion of right current artery, mild to moderate mitral regurgitation w/preserved left ventricular systolic function Dr. Melburn Popper ? ventricular tachycardia when she was 42  . Cancer     breast  . Cerebrovascular disease   . Aortic stenosis   . Mitral regurgitation   . Ventricular tachycardia   . Stroke     HX OF TIA  . Shortness of breath    Past Surgical History  Procedure Date  . Cardiac catheterization 06/2004    single-vessel coronary artery disease with occlusion of the right current artery, mild to moderate mitral regurgitation with preserved left ventricular systolic function Dr Elease Hashimoto .  ? Ventricular tachycardia at 42.  . Mastectomy 1967 and 1997  . Joint replacement 2010    total hip  . Tonsillectomy   . Lobectomy 1970  . Breast surgery     MASTECTOMY    reports that she has quit smoking. She has never used smokeless tobacco. She reports that she drinks about 1.2 ounces of alcohol per week. She reports that she does  not use illicit drugs. family history includes Heart disease in her mother; Lung disease in her mother; and Other in her father. No Known Allergies   Review of Systems see hpi     Objective:   Physical Exam  Nursing note and vitals reviewed. Constitutional: She appears well-developed and well-nourished. No distress.  HENT:  Head: Normocephalic and atraumatic.  Right Ear: External ear normal.  Left Ear: External ear normal.  Nose: Nose normal.  Eyes: Conjunctivae and EOM are normal. Pupils are equal, round, and reactive to light. Right eye exhibits no discharge. Left eye exhibits no discharge. No scleral icterus.  Neurological: She is alert.  Skin: Skin is warm and dry. She is not diaphoretic.       Above left brow- small area of soft tissue swelling and minimal erythema ~2-3cm. No drainage or warmth. Also mild periorbital soft tissue swelling.  Psychiatric: She has a normal mood and affect.          Assessment & Plan:

## 2012-01-27 NOTE — Assessment & Plan Note (Signed)
Pulmonary consult

## 2012-02-12 ENCOUNTER — Other Ambulatory Visit: Payer: Self-pay | Admitting: *Deleted

## 2012-02-12 MED ORDER — SIMVASTATIN 40 MG PO TABS: 40.0000 mg | ORAL_TABLET | Freq: Every day | ORAL | Status: DC

## 2012-02-12 NOTE — Telephone Encounter (Signed)
Rx done; pt informed/SLS 

## 2012-02-18 ENCOUNTER — Encounter: Payer: Self-pay | Admitting: Pulmonary Disease

## 2012-02-18 ENCOUNTER — Ambulatory Visit (INDEPENDENT_AMBULATORY_CARE_PROVIDER_SITE_OTHER): Payer: Medicare Other | Admitting: Pulmonary Disease

## 2012-02-18 VITALS — BP 120/70 | HR 71 | Temp 97.5°F | Ht 63.0 in | Wt 123.6 lb

## 2012-02-18 DIAGNOSIS — R0609 Other forms of dyspnea: Secondary | ICD-10-CM | POA: Insufficient documentation

## 2012-02-18 DIAGNOSIS — J449 Chronic obstructive pulmonary disease, unspecified: Secondary | ICD-10-CM

## 2012-02-18 NOTE — Assessment & Plan Note (Signed)
The patient has chronic dyspnea on exertion, but feels that her symptoms have worsened over the last one year.  It is unclear if her obstructive lung disease has worsened over time, how much of this is a conditioning issue, and how much could be related to underlying cardiac disease.  She has a history of coronary disease, and this has not been evaluated recently.  This will need to be kept in mind.

## 2012-02-18 NOTE — Assessment & Plan Note (Signed)
The patient has a history of mild airflow obstruction dating back to 2011, and feels that she has had some improvement with more consistent use of her symbicort.  She also has a history of a right lower lobectomy in 1979, and this certainly can contribute to her breathing issues.  At this point, I would like to check full PFTs, and compare to her values from 2011.  If she has significant worsening of her obstructive lung disease, would add Spiriva to her drug regimen.  If she has very little change, I am not sure that it is worth intensifying her bronchodilator regimen.  I have encouraged her to work aggressively on a conditioning program, and even discussed pulmonary rehabilitation.  However, she feels that she cannot afford the co-pays.

## 2012-02-18 NOTE — Progress Notes (Signed)
  Subjective:    Patient ID: Janet Allison, female    DOB: Jan 24, 1926, 78 y.o.   MRN: [id number]  HPI The patient is an 78 year old female who I've been asked to see for management of COPD and also dyspnea on exertion.  The patient has had chronic dyspnea on exertion for years, but the past one year feels that her symptoms have worsened.  She will get winded walking a medium pace for 20 minutes or less, and we'll also get short of breath bringing groceries in from the car on many days.  She will also get winded walking up one flight of stairs.  She describes day to day variability in her symptoms, but denies any significant cough and only minimal mucus production.  The patient had spirometry in 2011 that showed mild airflow obstruction, and a recent chest x-ray shows hyperinflation with a chronically elevated right hemidiaphragm after lung resection.  She tells me that she has had a right lower lobectomy in 1979 because of massive hemoptysis.  The patient also has a history of coronary disease, and although has had an echo, she has not had a stress test.  The patient was started on symbicort years ago, but she did not take on a regular basis.  She feels there may be some improvement since taking twice a day regularly.   Review of Systems  Constitutional: Positive for unexpected weight change. Negative for fever.  HENT: Positive for congestion, rhinorrhea and sinus pressure. Negative for ear pain, nosebleeds, sore throat, sneezing, trouble swallowing, dental problem and postnasal drip.   Eyes: Negative for redness and itching.  Respiratory: Positive for cough and shortness of breath. Negative for chest tightness and wheezing.   Cardiovascular: Positive for leg swelling. Negative for palpitations.  Gastrointestinal: Negative for nausea and vomiting.  Genitourinary: Negative for dysuria.  Musculoskeletal: Positive for joint swelling.  Skin: Negative for rash.  Neurological: Negative for headaches.    Hematological: Bruises/bleeds easily.  Psychiatric/Behavioral: Negative for dysphoric mood. The patient is not nervous/anxious.        Objective:   Physical Exam Constitutional:  Well developed, no acute distress  HENT:  Nares without discharge, but severe septal deviation to left with near obstruction  Oropharynx without exudate, palate and uvula are normal  Eyes:  Perrla, eomi, no scleral icterus  Neck:  No JVD, no TMG  Cardiovascular:  Normal rate, regular rhythm, no rubs or gallops.  2/6 sem        Intact distal pulses  Pulmonary :  Mildly decreased breath sounds, no stridor or respiratory distress   No rales, rhonchi, or wheezing  Abdominal:  Soft, nondistended, bowel sounds present.  No tenderness noted.   Musculoskeletal:  No lower extremity edema noted.  Lymph Nodes:  No cervical lymphadenopathy noted  Skin:  No cyanosis noted  Neurologic:  Alert, appropriate, moves all 4 extremities without obvious deficit.         Assessment & Plan:

## 2012-02-18 NOTE — Patient Instructions (Addendum)
Will schedule for breathing studies, and will arrange followup once results are available. Would stay on symbicort twice a day for now. Work on Public affairs consultant.

## 2012-03-02 ENCOUNTER — Ambulatory Visit (INDEPENDENT_AMBULATORY_CARE_PROVIDER_SITE_OTHER): Payer: Medicare Other | Admitting: Pulmonary Disease

## 2012-03-02 DIAGNOSIS — J4489 Other specified chronic obstructive pulmonary disease: Secondary | ICD-10-CM

## 2012-03-02 DIAGNOSIS — J449 Chronic obstructive pulmonary disease, unspecified: Secondary | ICD-10-CM

## 2012-03-02 LAB — PULMONARY FUNCTION TEST

## 2012-03-02 NOTE — Progress Notes (Signed)
PFT done today. 

## 2012-03-08 ENCOUNTER — Telehealth: Payer: Self-pay | Admitting: Pulmonary Disease

## 2012-03-08 NOTE — Telephone Encounter (Signed)
Please let pt know that her breathing studies show mild to moderate copd.  She has had some decline from 2011, but expected to lose lung capacity as a natural part of aging over 4 yrs.   Would like to keep her on the same meds, and see her back in 6mos.  Needs to work on conditioning as we discussed at last visit.

## 2012-03-10 NOTE — Telephone Encounter (Signed)
[email or web address].Iyanna Drummer, CMA  

## 2012-03-10 NOTE — Telephone Encounter (Signed)
Pt is aware of breathing results per Dr. Shelle Iron and has been scheduled for follow-up on 09/06/12 @ 10:30. Pt also aware to continue on her current medication regimen.

## 2012-03-12 ENCOUNTER — Encounter: Payer: Self-pay | Admitting: Pulmonary Disease

## 2012-03-31 ENCOUNTER — Ambulatory Visit (INDEPENDENT_AMBULATORY_CARE_PROVIDER_SITE_OTHER): Payer: Medicare Other | Admitting: *Deleted

## 2012-03-31 DIAGNOSIS — Z23 Encounter for immunization: Secondary | ICD-10-CM

## 2012-04-13 ENCOUNTER — Telehealth: Payer: Self-pay | Admitting: Internal Medicine

## 2012-04-13 MED ORDER — BUDESONIDE-FORMOTEROL FUMARATE 80-4.5 MCG/ACT IN AERO: 2.0000 | INHALATION_SPRAY | Freq: Two times a day (BID) | RESPIRATORY_TRACT | Status: DC

## 2012-04-13 NOTE — Telephone Encounter (Signed)
Rx to pharmacy/SLS 

## 2012-04-13 NOTE — Telephone Encounter (Signed)
symbicort 80/4 120 inh 10.2 gm  Inhale 2 puffs into the lungsa twice daily rinse mouth after use last fill 02-18-2012

## 2012-04-26 ENCOUNTER — Telehealth: Payer: Self-pay | Admitting: Internal Medicine

## 2012-04-26 MED ORDER — NEBIVOLOL HCL 20 MG PO TABS: 20.0000 mg | ORAL_TABLET | Freq: Every day | ORAL | Status: DC

## 2012-04-26 NOTE — Telephone Encounter (Signed)
Refill- bystolic 20mg  tablets. Take one tablet by mouth daily. Qty 90 last fill 10.20.13

## 2012-04-26 NOTE — Telephone Encounter (Signed)
Rx to pharmacy/SLS 

## 2012-05-03 ENCOUNTER — Other Ambulatory Visit: Payer: Self-pay | Admitting: Internal Medicine

## 2012-05-03 MED ORDER — IPRATROPIUM BROMIDE 0.06 % NA SOLN: 2.0000 | Freq: Two times a day (BID) | NASAL | Status: DC

## 2012-05-03 NOTE — Telephone Encounter (Signed)
Rx to pharmacy/SLS 

## 2012-05-05 ENCOUNTER — Ambulatory Visit (INDEPENDENT_AMBULATORY_CARE_PROVIDER_SITE_OTHER): Payer: Medicare Other | Admitting: Internal Medicine

## 2012-05-05 ENCOUNTER — Telehealth: Payer: Self-pay | Admitting: Internal Medicine

## 2012-05-05 ENCOUNTER — Encounter: Payer: Self-pay | Admitting: Internal Medicine

## 2012-05-05 VITALS — BP 130/84 | HR 69 | Temp 97.4°F | Resp 14 | Wt 123.0 lb

## 2012-05-05 DIAGNOSIS — R350 Frequency of micturition: Secondary | ICD-10-CM

## 2012-05-05 DIAGNOSIS — R8290 Unspecified abnormal findings in urine: Secondary | ICD-10-CM

## 2012-05-05 DIAGNOSIS — Z79899 Other long term (current) drug therapy: Secondary | ICD-10-CM

## 2012-05-05 DIAGNOSIS — R82998 Other abnormal findings in urine: Secondary | ICD-10-CM

## 2012-05-05 DIAGNOSIS — E785 Hyperlipidemia, unspecified: Secondary | ICD-10-CM

## 2012-05-05 DIAGNOSIS — I10 Essential (primary) hypertension: Secondary | ICD-10-CM

## 2012-05-05 DIAGNOSIS — R39198 Other difficulties with micturition: Secondary | ICD-10-CM

## 2012-05-05 DIAGNOSIS — R3919 Other difficulties with micturition: Secondary | ICD-10-CM

## 2012-05-05 DIAGNOSIS — R32 Unspecified urinary incontinence: Secondary | ICD-10-CM

## 2012-05-05 LAB — POCT URINALYSIS DIPSTICK
Bilirubin, UA: NEGATIVE
Glucose, UA: NEGATIVE
Ketones, UA: NEGATIVE
Leukocytes, UA: NEGATIVE
Nitrite, UA: NEGATIVE
Protein, UA: NEGATIVE
Spec Grav, UA: <=1.005
Urobilinogen, UA: 0.2
pH, UA: 7

## 2012-05-05 MED ORDER — SULFAMETHOXAZOLE-TRIMETHOPRIM 800-160 MG PO TABS: 1.0000 | ORAL_TABLET | Freq: Two times a day (BID) | ORAL | Status: DC

## 2012-05-05 NOTE — Telephone Encounter (Signed)
Lab orders placed for return on Monday 12.09.13 per Vo, [facility] [AVS]/SLS

## 2012-05-05 NOTE — Patient Instructions (Signed)
Please return on Monday for fasting labs Cbc-401.9, chem7-v58.69, lipid/lft-272.4

## 2012-05-05 NOTE — Telephone Encounter (Signed)
Lab order week of 08-30-12 Cbc-401.9, chem7-v58.69, lipid/lft-272.4

## 2012-05-07 LAB — URINE CULTURE: Colony Count: NO GROWTH

## 2012-05-08 DIAGNOSIS — N3281 Overactive bladder: Secondary | ICD-10-CM | POA: Insufficient documentation

## 2012-05-08 NOTE — Progress Notes (Signed)
  Subjective:    Patient ID: Janet Allison, female    DOB: [birth year], 78 y.o.   MRN: [id number]  HPI Followup if no improvement or worsening. Notes several  month h/o urinary frequency and incontinence but recently worse x one month. No f/c or hematuria.   Past Medical History  Diagnosis Date  . Allergy     osteoarthritis  . COPD (chronic obstructive pulmonary disease)   . GERD (gastroesophageal reflux disease)   . Hypertension   . Thyroid nodule   . TIA (transient ischemic attack)   . Vertigo, intermittent   . Hyperlipidemia   . Osteoarthrosis, unspecified whether generalized or localized, unspecified site   . CAD (coronary artery disease)     cath 06/2004 single-vessel cad w/occlusion of right current artery, mild to moderate mitral regurgitation w/preserved left ventricular systolic function Dr. Melburn Popper ? ventricular tachycardia when she was 42  . Cancer     breast  . Cerebrovascular disease   . Aortic stenosis   . Mitral regurgitation   . Ventricular tachycardia   . Stroke     HX OF TIA  . Shortness of breath    Past Surgical History  Procedure Date  . Cardiac catheterization 06/2004    single-vessel coronary artery disease with occlusion of the right current artery, mild to moderate mitral regurgitation with preserved left ventricular systolic function Dr Elease Hashimoto .  ? Ventricular tachycardia at 42.  . Mastectomy 1967 and 1997  . Joint replacement 2010    total hip  . Tonsillectomy   . Lobectomy 1970  . Breast surgery     MASTECTOMY    reports that she quit smoking about 28 years ago. Her smoking use included Cigarettes. She has a 70 pack-year smoking history. She has never used smokeless tobacco. She reports that she drinks about 1.2 ounces of alcohol per week. She reports that she does not use illicit drugs. family history includes Heart disease in her mother; Lung disease in her mother; and Other in her father. No Known Allergies    Review of Systems see hpi      Objective:   Physical Exam  Physical Exam  Nursing note and vitals reviewed. Constitutional: Appears well-developed and well-nourished. No distress.  HENT:  Head: Normocephalic and atraumatic.  Right Ear: External ear normal.  Left Ear: External ear normal.  Eyes: Conjunctivae are normal. No scleral icterus.  Neck: Neck supple. Carotid bruit is not present.  Cardiovascular: Normal rate, regular rhythm and normal heart sounds.  Exam reveals no gallop and no friction rub.   No murmur heard. Pulmonary/Chest: Effort normal and breath sounds normal. No respiratory distress. He has no wheezes. no rales.  Lymphadenopathy:    He has no cervical adenopathy.  Neurological:Alert.  Skin: Skin is warm and dry. Not diaphoretic.  Psychiatric: Has a normal mood and affect.        Assessment & Plan:

## 2012-05-08 NOTE — Assessment & Plan Note (Signed)
Consider and r/o infection. Obtain ua and cx. Begin course of abx. Begin kegle exercises. Consider vesicare if sx's persist.

## 2012-05-11 ENCOUNTER — Telehealth: Payer: Self-pay | Admitting: *Deleted

## 2012-05-11 NOTE — Telephone Encounter (Signed)
Received message from pt on 05/10/12 stating she has developed nausea since taking Bactrim for possible UTI. Pt has taken 4 days of abx and wants to know if she can stop it. Provider called pt and notified her that she could stop abx and if symptoms return may need to consider treatment with medication other than antibiotic.

## 2012-05-17 LAB — LIPID PANEL
HDL: 79 mg/dL (ref >39)
LDL Cholesterol: 106 mg/dL — ABNORMAL HIGH (ref 0–99)
Total CHOL/HDL Ratio: 2.5 Ratio
VLDL: 15 mg/dL (ref 0–40)

## 2012-05-17 LAB — CBC WITH DIFFERENTIAL/PLATELET
Eosinophils Relative: 2 % (ref 0–5)
Lymphocytes Relative: 34 % (ref 12–46)
Lymphs Abs: 1.7 10*3/uL (ref 0.7–4.0)
MCV: 97.5 fL (ref 78.0–100.0)
Platelets: 209 10*3/uL (ref 150–400)
RBC: 4.44 MIL/uL (ref 3.87–5.11)
WBC: 4.9 10*3/uL (ref 4.0–10.5)

## 2012-05-17 LAB — BASIC METABOLIC PANEL
CO2: 24 mEq/L (ref 19–32)
Chloride: 98 mEq/L (ref 96–112)
Creat: 1.02 mg/dL (ref 0.50–1.10)
Sodium: 135 mEq/L (ref 135–145)

## 2012-05-17 LAB — HEPATIC FUNCTION PANEL
Albumin: 4.7 g/dL (ref 3.5–5.2)
Total Bilirubin: 0.8 mg/dL (ref 0.3–1.2)

## 2012-05-17 NOTE — Addendum Note (Signed)
Addended by: Mervin Kung A on: 05/17/2012 10:01 AM   Modules accepted: Orders

## 2012-05-17 NOTE — Telephone Encounter (Signed)
Pt presented to the lab, future orders released and given to the lab.

## 2012-05-18 ENCOUNTER — Other Ambulatory Visit: Payer: Self-pay | Admitting: *Deleted

## 2012-05-18 NOTE — Telephone Encounter (Signed)
Ok for same 

## 2012-05-18 NOTE — Telephone Encounter (Signed)
Request for Zolpidem 5 mg Take [1] tablet PO QHs PRN sleep [Previously px 10 mg Take 0.5 tablet QHs PRN]; last Rx 06.24.13 #30x2/SLS Please advise.

## 2012-05-19 MED ORDER — ZOLPIDEM TARTRATE 5 MG PO TABS: 5.0000 mg | ORAL_TABLET | Freq: Every evening | ORAL | Status: DC | PRN

## 2012-05-19 NOTE — Telephone Encounter (Signed)
Rx phoned to pharmacy/SLS 

## 2012-06-28 ENCOUNTER — Telehealth: Payer: Self-pay | Admitting: Internal Medicine

## 2012-06-28 ENCOUNTER — Other Ambulatory Visit: Payer: Self-pay

## 2012-06-28 MED ORDER — FUROSEMIDE 20 MG PO TABS: 10.0000 mg | ORAL_TABLET | Freq: Every day | ORAL | Status: DC

## 2012-06-28 NOTE — Telephone Encounter (Signed)
Patient wanted RX sent to [facility]

## 2012-06-28 NOTE — Telephone Encounter (Signed)
Refill- furosemide 20mg  tab. Take one-half tablet by mouth daily. Qty 45 last fill 9.26.13

## 2012-07-07 ENCOUNTER — Encounter: Payer: Self-pay | Admitting: Internal Medicine

## 2012-07-07 ENCOUNTER — Ambulatory Visit (INDEPENDENT_AMBULATORY_CARE_PROVIDER_SITE_OTHER): Payer: Medicare Other | Admitting: Internal Medicine

## 2012-07-07 VITALS — BP 132/70 | HR 76 | Temp 96.9°F | Ht 63.0 in | Wt 119.6 lb

## 2012-07-07 DIAGNOSIS — R634 Abnormal weight loss: Secondary | ICD-10-CM

## 2012-07-07 DIAGNOSIS — R32 Unspecified urinary incontinence: Secondary | ICD-10-CM

## 2012-07-07 DIAGNOSIS — R350 Frequency of micturition: Secondary | ICD-10-CM

## 2012-07-07 DIAGNOSIS — F5104 Psychophysiologic insomnia: Secondary | ICD-10-CM

## 2012-07-07 DIAGNOSIS — G4700 Insomnia, unspecified: Secondary | ICD-10-CM

## 2012-07-07 LAB — POCT URINALYSIS DIPSTICK
Bilirubin, UA: NEGATIVE
Glucose, UA: NEGATIVE
Leukocytes, UA: NEGATIVE
Nitrite, UA: NEGATIVE

## 2012-07-07 MED ORDER — NEBIVOLOL HCL 20 MG PO TABS: 10.0000 mg | ORAL_TABLET | Freq: Every day | ORAL | Status: DC

## 2012-07-07 MED ORDER — ZOLPIDEM TARTRATE 5 MG PO TABS: 5.0000 mg | ORAL_TABLET | Freq: Every evening | ORAL | Status: DC | PRN

## 2012-07-07 MED ORDER — SOLIFENACIN SUCCINATE 5 MG PO TABS: 5.0000 mg | ORAL_TABLET | Freq: Every day | ORAL | Status: DC

## 2012-07-07 NOTE — Progress Notes (Signed)
Subjective:    Patient ID: Janet Allison, female    DOB: 19-Jul-1925, 79 y.o.   MRN: [id number]  HPI New patient to me but known to our practice, transferred here from [facility] clinic Reviewed chronic medical issues today:  COPD -mild to moderate disease per PFTs October 2013. On medications for same, reports 100% compliance. Follows with pulmonary q6 months and as needed. No recent flares or exacerbations symptoms but chronic dyspnea on exertion without change  hypertension - the patient reports compliance with medication(s) as prescribed. Denies adverse side effects.  Dyslipidemia -on simvastatin -the patient reports compliance with medication(s) as prescribed. Denies adverse side effects.  Coronary artery disease, medical management ongoing -follows with cardiology annually and as needed -no anginal symptoms  Continued nocturia and frequency -no dysuria, fever or hematuria  Chronic insomnia, uses Ambien as needed but insurance now limiting to 90 tablets per year -? Other alternatives, no prior prescription medication used  Also complains of unintended weight loss -related to poor appetite and decreased intake -denies nausea, abdominal pain, bowel changes. no palpitations, night sweats or medication changes  Past Medical History  Diagnosis Date  . Allergy     osteoarthritis  . COPD (chronic obstructive pulmonary disease)   . GERD (gastroesophageal reflux disease)   . Hypertension   . Thyroid nodule   . TIA (transient ischemic attack)   . Vertigo, intermittent   . Hyperlipidemia   . Osteoarthrosis, unspecified whether generalized or localized, unspecified site   . CAD (coronary artery disease)     cath 06/2004 single-vessel cad w/occlusion of right current artery, mild to moderate mitral regurgitation w/preserved left ventricular systolic function Dr. Melburn Popper ? ventricular tachycardia when she was 42  . Breast cancer   . Cerebrovascular disease   . Aortic stenosis   . Mitral  regurgitation   . Ventricular tachycardia     Review of Systems  Constitutional: Negative for fever and fatigue.  Genitourinary: Positive for urgency and frequency. Negative for dysuria, flank pain, difficulty urinating and pelvic pain.  Musculoskeletal: Positive for arthralgias. Negative for myalgias, joint swelling and gait problem.  Neurological: Negative for dizziness and headaches.       Objective:   Physical Exam BP 132/70  Pulse 76  Temp 96.9 F (36.1 C) (Oral)  Ht 5\' 3"  (1.6 m)  Wt 119 lb 9.6 oz (54.25 kg)  BMI 21.19 kg/m2  SpO2 96% Wt Readings from Last 3 Encounters:  07/07/12 119 lb 9.6 oz (54.25 kg)  05/05/12 123 lb (55.792 kg)  02/18/12 123 lb 9.6 oz (56.065 kg)   Constitutional: She is fit, appears well-developed and well-nourished. No distress.  Neck: Normal range of motion. Neck supple. No JVD present. No thyromegaly present.  Cardiovascular: Normal rate, regular rhythm and normal heart sounds.  No murmur heard. No BLE edema. Pulmonary/Chest: Effort normal and breath sounds normal. No respiratory distress. She has no wheezes.  Abdominal: Soft. Bowel sounds are normal. She exhibits no distension. There is no tenderness. no masses Skin: Skin is warm and dry. No rash noted. No erythema.  Psychiatric: She has a normal mood and affect. Her behavior is normal. Judgment and thought content normal.   Lab Results  Component Value Date   WBC 4.9 05/17/2012   HGB 14.6 05/17/2012   HCT 43.3 05/17/2012   PLT 209 05/17/2012   GLUCOSE 90 05/17/2012   CHOL 200 05/17/2012   TRIG 76 05/17/2012   HDL 79 05/17/2012   LDLCALC 106* 05/17/2012  ALT 21 05/17/2012   AST 24 05/17/2012   NA 135 05/17/2012   K 5.3 05/17/2012   CL 98 05/17/2012   CREATININE 1.02 05/17/2012   BUN 35* 05/17/2012   CO2 24 05/17/2012   TSH 1.193 11/28/2011   INR 1.1 07/10/2008       Assessment & Plan:   See problem list. Medications and labs reviewed today.  Weight loss, unintentional -  suspect related to poor intake -will start food journal to evaluate caloric intake -consider other labs or imaging as needed if continued weight loss trend

## 2012-07-07 NOTE — Assessment & Plan Note (Signed)
Advised on prescription therapies for insomnia Prescription for her 90 Ambien tablets in 2014 provided to the order pharmacy today  Consider alternate prescription therapy if no relief with over-the-counter meds

## 2012-07-07 NOTE — Patient Instructions (Signed)
It was good to see you today. We have reviewed your prior records including labs and tests today Start Vesicare once daily for overactive bladder symptoms -this has been sent to your local pharmacy Ambien 5 mg sent to your mail order pharmacy for 90 pills as discussed In the meanwhile, try over-the-counter medication such as Benadryl 25 mg at bedtime, simply sleep 1 tablet at bedtime or melatonin 5-10 mg at bedtime -any of these medicines can help you with sleep Look into [email or web address]cy or other type of food journal to assist you in with monitoring caloric intake to counter balance your weight loss -if continued unintentional weight loss trend, will recheck labs at next visit -your goal should be 1500 calories daily or as directed by website Please schedule followup in 3-6 months for weight, blood pressure and medication review, call sooner if problems.

## 2012-07-07 NOTE — Assessment & Plan Note (Signed)
No evidence for UTI on current care urinalysis today or prior culture - Unchanged symptoms with empiric antibiotic therapy Start Vesicare now and titrate as needed

## 2012-07-19 ENCOUNTER — Encounter: Payer: Self-pay | Admitting: Internal Medicine

## 2012-07-21 MED ORDER — NEBIVOLOL HCL 20 MG PO TABS: 10.0000 mg | ORAL_TABLET | Freq: Every day | ORAL | Status: DC

## 2012-08-11 ENCOUNTER — Encounter: Payer: Self-pay | Admitting: Internal Medicine

## 2012-08-11 ENCOUNTER — Other Ambulatory Visit: Payer: Self-pay | Admitting: *Deleted

## 2012-08-11 MED ORDER — LOSARTAN POTASSIUM 50 MG PO TABS: 50.0000 mg | ORAL_TABLET | Freq: Two times a day (BID) | ORAL | Status: DC

## 2012-08-11 MED ORDER — NEBIVOLOL HCL 20 MG PO TABS: 10.0000 mg | ORAL_TABLET | Freq: Every day | ORAL | Status: DC

## 2012-08-11 MED ORDER — TRAMADOL-ACETAMINOPHEN 37.5-325 MG PO TABS
1.0000 | ORAL_TABLET | Freq: Every day | ORAL | Status: AC | PRN
Start: 2012-08-11 — End: ?

## 2012-08-11 MED ORDER — SIMVASTATIN 40 MG PO TABS: 40.0000 mg | ORAL_TABLET | Freq: Every day | ORAL | Status: DC

## 2012-08-11 NOTE — Telephone Encounter (Signed)
Pt sent email needing refills on bystolic, simvastatin, losartan & Tramadol...Raechel Chute

## 2012-09-06 ENCOUNTER — Ambulatory Visit: Payer: Medicare Other | Admitting: Pulmonary Disease

## 2012-09-07 ENCOUNTER — Ambulatory Visit: Payer: Medicare Other | Admitting: Internal Medicine

## 2012-09-08 ENCOUNTER — Encounter: Payer: Self-pay | Admitting: Internal Medicine

## 2012-09-08 MED ORDER — MIRABEGRON ER 25 MG PO TB24: 25.0000 mg | ORAL_TABLET | Freq: Every day | ORAL | Status: DC

## 2012-09-22 ENCOUNTER — Encounter: Payer: Self-pay | Admitting: Internal Medicine

## 2012-09-24 ENCOUNTER — Encounter: Payer: Self-pay | Admitting: Internal Medicine

## 2012-10-13 ENCOUNTER — Encounter (INDEPENDENT_AMBULATORY_CARE_PROVIDER_SITE_OTHER): Payer: Medicare Other

## 2012-10-13 DIAGNOSIS — I6529 Occlusion and stenosis of unspecified carotid artery: Secondary | ICD-10-CM

## 2012-10-20 ENCOUNTER — Encounter: Payer: Self-pay | Admitting: Internal Medicine

## 2012-10-21 ENCOUNTER — Other Ambulatory Visit: Payer: Self-pay | Admitting: *Deleted

## 2012-10-21 MED ORDER — IPRATROPIUM BROMIDE 0.06 % NA SOLN: 2.0000 | Freq: Two times a day (BID) | NASAL | Status: DC

## 2012-10-21 NOTE — Telephone Encounter (Signed)
Sent email needing new rx for nasal spray sent to [facility]...lmb

## 2012-11-11 ENCOUNTER — Encounter: Payer: Self-pay | Admitting: Cardiology

## 2012-11-14 ENCOUNTER — Other Ambulatory Visit: Payer: Self-pay | Admitting: Internal Medicine

## 2012-11-15 MED ORDER — LOSARTAN POTASSIUM 50 MG PO TABS: 50.0000 mg | ORAL_TABLET | Freq: Two times a day (BID) | ORAL | Status: DC

## 2013-01-19 ENCOUNTER — Ambulatory Visit: Payer: Medicare Other | Admitting: Physician Assistant

## 2013-02-02 ENCOUNTER — Ambulatory Visit: Payer: Medicare Other | Admitting: Nurse Practitioner

## 2013-02-25 ENCOUNTER — Other Ambulatory Visit (INDEPENDENT_AMBULATORY_CARE_PROVIDER_SITE_OTHER): Payer: Medicare Other

## 2013-02-25 ENCOUNTER — Ambulatory Visit (INDEPENDENT_AMBULATORY_CARE_PROVIDER_SITE_OTHER): Payer: Medicare Other | Admitting: Internal Medicine

## 2013-02-25 ENCOUNTER — Encounter: Payer: Self-pay | Admitting: Internal Medicine

## 2013-02-25 VITALS — BP 142/70 | HR 81 | Temp 97.0°F | Wt 114.8 lb

## 2013-02-25 DIAGNOSIS — R634 Abnormal weight loss: Secondary | ICD-10-CM

## 2013-02-25 DIAGNOSIS — Z23 Encounter for immunization: Secondary | ICD-10-CM

## 2013-02-25 DIAGNOSIS — N318 Other neuromuscular dysfunction of bladder: Secondary | ICD-10-CM

## 2013-02-25 DIAGNOSIS — J449 Chronic obstructive pulmonary disease, unspecified: Secondary | ICD-10-CM

## 2013-02-25 DIAGNOSIS — E785 Hyperlipidemia, unspecified: Secondary | ICD-10-CM

## 2013-02-25 DIAGNOSIS — N3281 Overactive bladder: Secondary | ICD-10-CM

## 2013-02-25 DIAGNOSIS — Z853 Personal history of malignant neoplasm of breast: Secondary | ICD-10-CM

## 2013-02-25 LAB — CBC WITH DIFFERENTIAL/PLATELET
Eosinophils Relative: 1.8 % (ref 0.0–5.0)
HCT: 42.5 % (ref 36.0–46.0)
Hemoglobin: 14.2 g/dL (ref 12.0–15.0)
Lymphocytes Relative: 25.8 % (ref 12.0–46.0)
Lymphs Abs: 1.8 10*3/uL (ref 0.7–4.0)
Monocytes Relative: 11 % (ref 3.0–12.0)
Neutro Abs: 4.3 10*3/uL (ref 1.4–7.7)
Platelets: 278 10*3/uL (ref 150.0–400.0)
WBC: 7.1 10*3/uL (ref 4.5–10.5)

## 2013-02-25 LAB — URINALYSIS, ROUTINE W REFLEX MICROSCOPIC
Bilirubin Urine: NEGATIVE
Nitrite: NEGATIVE
Specific Gravity, Urine: 1.01 (ref 1.000–1.030)
Total Protein, Urine: NEGATIVE
Urine Glucose: NEGATIVE
pH: 7 (ref 5.0–8.0)

## 2013-02-25 LAB — BASIC METABOLIC PANEL
BUN: 21 mg/dL (ref 6–23)
Calcium: 10.3 mg/dL (ref 8.4–10.5)
GFR: 64.94 mL/min (ref >60.00)
Glucose, Bld: 94 mg/dL (ref 70–99)
Potassium: 5.3 mEq/L — ABNORMAL HIGH (ref 3.5–5.1)
Sodium: 137 mEq/L (ref 135–145)

## 2013-02-25 LAB — LIPID PANEL
Cholesterol: 188 mg/dL (ref 0–200)
Triglycerides: 87 mg/dL (ref 0.0–149.0)

## 2013-02-25 LAB — HEPATIC FUNCTION PANEL
ALT: 38 U/L — ABNORMAL HIGH (ref 0–35)
Bilirubin, Direct: 0.1 mg/dL (ref 0.0–0.3)
Total Protein: 8.1 g/dL (ref 6.0–8.3)

## 2013-02-25 LAB — TSH: TSH: 1.67 u[IU]/mL (ref 0.35–5.50)

## 2013-02-25 MED ORDER — MIRABEGRON ER 25 MG PO TB24: 25.0000 mg | ORAL_TABLET | Freq: Every day | ORAL | Status: DC

## 2013-02-25 NOTE — Patient Instructions (Signed)
It was good to see you today. We have reviewed your prior records including labs and tests today Medications reviewed and updated, no changes recommended at this time. Test(s) ordered today. Your results will be released to MyChart (or called to you) after review, usually within 72hours after test completion. If any changes need to be made, you will be notified at that same time. Look into [email or web address] or other type of food journal to assist you in with monitoring caloric intake to counter balance your weight loss -if continued unintentional weight loss trend, will recheck labs at next visit -your goal should be 1500 calories daily or as directed by website Please schedule followup in 3-6 months for weight, blood pressure and medication review, call sooner if problems.

## 2013-02-25 NOTE — Progress Notes (Signed)
Subjective:    Patient ID: Janet Allison, female    DOB: 11-Dec-1925, 79 y.o.   MRN: [id number]  HPI Here for follow up - reviewed chronic medical issues today:  COPD -mild to moderate disease per PFTs October 2013. On medications for same, reports 100% compliance. Follows with pulmonary q6 months and as needed. No recent flares or exacerbations symptoms but chronic dyspnea on exertion without change  hypertension - the patient reports compliance with medication(s) as prescribed. Denies adverse side effects.  Dyslipidemia -on simvastatin -the patient reports compliance with medication(s) as prescribed. Denies adverse side effects.  Coronary artery disease, medical management ongoing -follows with cardiology annually and as needed -no anginal symptoms  Continued nocturia and frequency -no dysuria, fever or hematuria - improved with Myrbetiq  Chronic insomnia, uses Ambien as needed but insurance now limiting to 90 tablets per year -? Other alternatives, no prior prescription medication used  Also continued unintended weight loss -related to poor appetite and decreased intake -denies nausea, abdominal pain, bowel changes. no palpitations, night sweats or medication changes  ?also re: occ L arch pain in foot  also ?genetic testing for hx breast ca to share with her dtr and g-dtrs  Past Medical History  Diagnosis Date  . COPD (chronic obstructive pulmonary disease)     mild-mod on PFTs 03/2012  . GERD (gastroesophageal reflux disease)   . Hypertension   . TIA (transient ischemic attack)   . Vertigo, intermittent   . Hyperlipidemia   . Osteoarthrosis, unspecified whether generalized or localized, unspecified site   . CAD (coronary artery disease)     cath 06/2004 single-vessel cad w/occlusion of right current artery, mild to moderate mitral regurgitation w/preserved left ventricular systolic function Dr. Melburn Popper ? ventricular tachycardia when she was 42  . Breast cancer   .  Cerebrovascular disease   . Aortic stenosis   . Mitral regurgitation   . Ventricular tachycardia     Review of Systems  Constitutional: Negative for fever and fatigue.  Genitourinary: Positive for urgency and frequency. Negative for dysuria, flank pain, difficulty urinating and pelvic pain.  Musculoskeletal: Positive for arthralgias. Negative for myalgias, joint swelling and gait problem.  Neurological: Negative for dizziness and headaches.       Objective:   Physical Exam BP 142/70  Pulse 81  Temp(Src) 97 F (36.1 C) (Oral)  Wt 114 lb 12.8 oz (52.073 kg)  BMI 20.34 kg/m2  SpO2 95% Wt Readings from Last 3 Encounters:  02/25/13 114 lb 12.8 oz (52.073 kg)  07/07/12 119 lb 9.6 oz (54.25 kg)  05/05/12 123 lb (55.792 kg)   Constitutional: She is fit, appears well-developed and well-nourished. No distress.  Neck: Normal range of motion. Neck supple. No JVD present. No thyromegaly present.  Cardiovascular: Normal rate, regular rhythm and normal heart sounds.  No murmur heard. No BLE edema. Pulmonary/Chest: Effort normal and breath sounds normal. No respiratory distress. She has no wheezes.  Abdominal: Soft. Bowel sounds are normal. She exhibits no distension. There is no tenderness. no masses MSkel: L foot without deformity or soft tissue swelling Skin: Skin is warm and dry. No rash noted. No erythema.  Psychiatric: She has a normal mood and affect. Her behavior is normal. Judgment and thought content normal.   Lab Results  Component Value Date   WBC 4.9 05/17/2012   HGB 14.6 05/17/2012   HCT 43.3 05/17/2012   PLT 209 05/17/2012   GLUCOSE 90 05/17/2012   CHOL 200 05/17/2012   TRIG  76 05/17/2012   HDL 79 05/17/2012   LDLCALC 106* 05/17/2012   ALT 21 05/17/2012   AST 24 05/17/2012   NA 135 05/17/2012   K 5.3 05/17/2012   CL 98 05/17/2012   CREATININE 1.02 05/17/2012   BUN 35* 05/17/2012   CO2 24 05/17/2012   TSH 1.193 11/28/2011   INR 1.1 07/10/2008       Assessment &  Plan:   See problem list. Medications and labs reviewed today.  Weight loss, unintentional - suspect related to poor intake - Check screening labs now consider other labs or imaging as needed if continued weight loss trend  L foot pain - intermittent, not present today - exam benign - advised supportive foot wear at all times - to call if worse or unimproved  Hx breast ca - age 89yo, then recurrent age 71yo - will refer to local onc to discuss need for genetic testing to share with her family

## 2013-02-26 NOTE — Assessment & Plan Note (Signed)
On statin Check annual and titrate as needed 

## 2013-02-26 NOTE — Assessment & Plan Note (Signed)
No evidence for UTI on current care urinalysis today or prior culture - Unchanged symptoms with empiric antibiotic therapy Intol of Vesicare 07/2012 due to side effects Tolerating low dose Myrbetiq well - continue same

## 2013-02-26 NOTE — Assessment & Plan Note (Signed)
Prior pulm eval 2013 reviewed Few daily symptoms The current medical regimen is effective;  continue present plan and medications.

## 2013-03-03 ENCOUNTER — Other Ambulatory Visit: Payer: Self-pay | Admitting: Family Medicine

## 2013-03-03 ENCOUNTER — Other Ambulatory Visit: Payer: Self-pay | Admitting: Internal Medicine

## 2013-03-04 ENCOUNTER — Encounter: Payer: Self-pay | Admitting: Cardiology

## 2013-03-04 ENCOUNTER — Ambulatory Visit (INDEPENDENT_AMBULATORY_CARE_PROVIDER_SITE_OTHER): Payer: Medicare Other | Admitting: Cardiology

## 2013-03-04 VITALS — BP 130/62 | HR 70 | Ht 63.0 in | Wt 115.0 lb

## 2013-03-04 DIAGNOSIS — I10 Essential (primary) hypertension: Secondary | ICD-10-CM

## 2013-03-04 DIAGNOSIS — I359 Nonrheumatic aortic valve disorder, unspecified: Secondary | ICD-10-CM

## 2013-03-04 DIAGNOSIS — I2510 Atherosclerotic heart disease of native coronary artery without angina pectoris: Secondary | ICD-10-CM

## 2013-03-04 DIAGNOSIS — E785 Hyperlipidemia, unspecified: Secondary | ICD-10-CM

## 2013-03-04 NOTE — Assessment & Plan Note (Signed)
Blood pressure controlled. Continue present medications. 

## 2013-03-04 NOTE — Progress Notes (Signed)
HPI: Pleasant female for FU of coronary artery disease. Cardiac catheterization in 2006 revealed a 20-30% LAD and an occluded right coronary artery. There were left to right collaterals. Ejection fraction was 60% with inferior hypokinesis. She is treated medically. Myoview in March of 2011 revealed EF 75% and inferior scar/ischemia could not be excluded. Medical therapy as area supplied by occluded RCA. Last echocardiogram in April 2013 showed normal LV function. There was a mid-cavitary gradient of 2.8 m/s. There was mild aortic stenosis with a mean gradient of 13 mm of mercury. There was mild mitral stenosis with a mean gradient of 6 mm of mercury and mild mitral regurgitation. Carotid Dopplers in May 2014 showed 0-39% bilateral stenosis. Since I last saw her, she notes dyspnea on exertion but no orthopnea, PND, palpitations, syncope or chest pain. Occasional mild pedal edema.   Current Outpatient Prescriptions  Medication Sig Dispense Refill  . albuterol (PROAIR HFA) 108 (90 BASE) MCG/ACT inhaler Inhale 2 puffs into the lungs every 6 (six) hours as needed for wheezing.      Marland Kitchen albuterol (PROVENTIL HFA;VENTOLIN HFA) 108 (90 BASE) MCG/ACT inhaler Inhale 2 puffs into the lungs every 4 (four) hours as needed. For shortness of breath      . aspirin 81 MG tablet Take 81 mg by mouth daily.        . budesonide-formoterol (SYMBICORT) 80-4.5 MCG/ACT inhaler Inhale 2 puffs into the lungs 2 (two) times daily. Rinse mouth after use  1 Inhaler  3  . co-enzyme Q-10 30 MG capsule Take 30 mg by mouth daily.      . furosemide (LASIX) 20 MG tablet Take one-half tablet by  mouth daily  45 tablet  0  . ipratropium (ATROVENT) 0.06 % nasal spray Use 2 sprays nasally two  times daily  15 mL  0  . losartan (COZAAR) 50 MG tablet Take 1 tablet by mouth 2  times daily.  180 tablet  1  . Magnesium 300 MG CAPS Take by mouth daily.      . mirabegron ER (MYRBETRIQ) 25 MG TB24 tablet Take 1 tablet (25 mg total) by mouth  daily.  90 tablet  3  . Multiple Vitamin (MULTIVITAMIN) tablet Take 1 tablet by mouth daily.      . Nebivolol HCl 20 MG TABS Take 0.5-1 tablets (10-20 mg total) by mouth daily. As directed  90 tablet  3  . simvastatin (ZOCOR) 40 MG tablet Take 1 tablet (40 mg total) by mouth at bedtime.  90 tablet  3  . traMADol-acetaminophen (ULTRACET) 37.5-325 MG per tablet Take 1 tablet by mouth daily as needed. For pain  90 tablet  0  . vitamin E 100 UNIT capsule Take 100 Units by mouth daily.      Marland Kitchen zolpidem (AMBIEN) 5 MG tablet Take 1 tablet (5 mg total) by mouth at bedtime as needed for sleep.  90 tablet  0  . nitroGLYCERIN (NITROSTAT) 0.4 MG SL tablet Place 1 tablet (0.4 mg total) under the tongue every 5 (five) minutes x 3 doses as needed for chest pain.  25 tablet  3   No current facility-administered medications for this visit.     Past Medical History  Diagnosis Date  . COPD (chronic obstructive pulmonary disease)     mild-mod on PFTs 03/2012  . GERD (gastroesophageal reflux disease)   . Hypertension   . TIA (transient ischemic attack)   . Vertigo, intermittent   . Hyperlipidemia   .  Osteoarthrosis, unspecified whether generalized or localized, unspecified site   . CAD (coronary artery disease)     cath 06/2004 single-vessel cad w/occlusion of right current artery, mild to moderate mitral regurgitation w/preserved left ventricular systolic function Dr. Melburn Popper ? ventricular tachycardia when she was 42  . Breast cancer   . Cerebrovascular disease   . Aortic stenosis   . Mitral regurgitation   . Ventricular tachycardia     Past Surgical History  Procedure Laterality Date  . Cardiac catheterization  06/2004    single-vessel coronary artery disease with occlusion of the right current artery, mild to moderate mitral regurgitation with preserved left ventricular systolic function Dr Elease Hashimoto .  ? Ventricular tachycardia at 42.  . Mastectomy  1967 and 1997  . Joint replacement  2010    total hip   . Tonsillectomy    . Lobectomy  1970    RLL due to massive hemoptysis  . Breast surgery      MASTECTOMY    History   Social History  . Marital Status: Widowed    Spouse Name: N/A    Number of Children: N/A  . Years of Education: N/A   Occupational History  . retired    Social History Main Topics  . Smoking status: Former Smoker -- 2.00 packs/day for 35 years    Types: Cigarettes    Quit date: 06/03/1983  . Smokeless tobacco: Never Used  . Alcohol Use: 1.2 oz/week    2 Glasses of wine per week  . Drug Use: No  . Sexual Activity: Not Currently    Birth Control/ Protection: Post-menopausal   Other Topics Concern  . Not on file   Social History Narrative   RETIRED   WIDOW   2 SONS 59, 57   1 DAUGHTER 55   FORMER TOBACCO USE   ETOH YES          ROS: no fevers or chills, productive cough, hemoptysis, dysphasia, odynophagia, melena, hematochezia, dysuria, hematuria, rash, seizure activity, orthopnea, PND, pedal edema, claudication. Remaining systems are negative.  Physical Exam: Well-developed well-nourished in no acute distress.  Skin is warm and dry.  HEENT is normal.  Neck is supple.  Chest is clear to auscultation with normal expansion.  Cardiovascular exam is regular rate and rhythm. 2/6 systolic murmur left sternal border. Abdominal exam nontender or distended. No masses palpated. Extremities show no edema. neuro grossly intact  ECG sinus rhythm at a rate of 70. Cannot rule out prior septal infarct. No ST changes.

## 2013-03-04 NOTE — Assessment & Plan Note (Signed)
Plan repeat echocardiogram. 

## 2013-03-04 NOTE — Assessment & Plan Note (Signed)
Continue aspirin and statin. Most recent carotid Dopplers did not suggest followup necessary.

## 2013-03-04 NOTE — Assessment & Plan Note (Signed)
Continue aspirin and statin. 

## 2013-03-04 NOTE — Assessment & Plan Note (Signed)
Continue statin. Lipids and liver monitored by primary care. 

## 2013-03-04 NOTE — Patient Instructions (Addendum)

## 2013-03-08 ENCOUNTER — Other Ambulatory Visit: Payer: Self-pay | Admitting: *Deleted

## 2013-03-08 MED ORDER — BUDESONIDE-FORMOTEROL FUMARATE 80-4.5 MCG/ACT IN AERO: 2.0000 | INHALATION_SPRAY | Freq: Two times a day (BID) | RESPIRATORY_TRACT | Status: DC

## 2013-03-22 ENCOUNTER — Ambulatory Visit (HOSPITAL_COMMUNITY): Payer: Medicare Other | Attending: Cardiology

## 2013-03-22 DIAGNOSIS — I2510 Atherosclerotic heart disease of native coronary artery without angina pectoris: Secondary | ICD-10-CM | POA: Insufficient documentation

## 2013-03-22 DIAGNOSIS — Z9010 Acquired absence of unspecified breast and nipple: Secondary | ICD-10-CM | POA: Insufficient documentation

## 2013-03-22 DIAGNOSIS — E785 Hyperlipidemia, unspecified: Secondary | ICD-10-CM | POA: Insufficient documentation

## 2013-03-22 DIAGNOSIS — Z853 Personal history of malignant neoplasm of breast: Secondary | ICD-10-CM | POA: Insufficient documentation

## 2013-03-22 DIAGNOSIS — I079 Rheumatic tricuspid valve disease, unspecified: Secondary | ICD-10-CM | POA: Insufficient documentation

## 2013-03-22 DIAGNOSIS — I10 Essential (primary) hypertension: Secondary | ICD-10-CM | POA: Insufficient documentation

## 2013-03-22 DIAGNOSIS — Z87891 Personal history of nicotine dependence: Secondary | ICD-10-CM | POA: Insufficient documentation

## 2013-03-22 DIAGNOSIS — Z8673 Personal history of transient ischemic attack (TIA), and cerebral infarction without residual deficits: Secondary | ICD-10-CM | POA: Insufficient documentation

## 2013-03-22 DIAGNOSIS — I379 Nonrheumatic pulmonary valve disorder, unspecified: Secondary | ICD-10-CM | POA: Insufficient documentation

## 2013-03-22 DIAGNOSIS — I359 Nonrheumatic aortic valve disorder, unspecified: Secondary | ICD-10-CM

## 2013-03-22 DIAGNOSIS — I080 Rheumatic disorders of both mitral and aortic valves: Secondary | ICD-10-CM | POA: Insufficient documentation

## 2013-03-22 NOTE — Progress Notes (Signed)
Echocardiogram performed.  

## 2013-04-01 ENCOUNTER — Encounter: Payer: Self-pay | Admitting: Internal Medicine

## 2013-04-01 MED ORDER — NEBIVOLOL HCL 20 MG PO TABS: 20.0000 mg | ORAL_TABLET | Freq: Every day | ORAL | Status: DC

## 2013-04-11 ENCOUNTER — Encounter: Payer: Self-pay | Admitting: Internal Medicine

## 2013-04-12 ENCOUNTER — Telehealth: Payer: Self-pay | Admitting: *Deleted

## 2013-04-12 NOTE — Telephone Encounter (Signed)
Received fax stating Bystolic 20 mg is on back order with no release date. Do md want to switch to 10 mg and have him to double up, or rx something else...lmb

## 2013-04-13 MED ORDER — NEBIVOLOL HCL 10 MG PO TABS: 10.0000 mg | ORAL_TABLET | Freq: Two times a day (BID) | ORAL | Status: AC

## 2013-04-13 NOTE — Telephone Encounter (Signed)
Ok to change to 10mg  tabs

## 2013-05-06 ENCOUNTER — Encounter: Payer: Self-pay | Admitting: Internal Medicine

## 2013-05-11 ENCOUNTER — Other Ambulatory Visit: Payer: Self-pay | Admitting: Internal Medicine

## 2013-05-21 ENCOUNTER — Other Ambulatory Visit: Payer: Self-pay | Admitting: Internal Medicine

## 2013-05-21 MED ORDER — IPRATROPIUM BROMIDE 0.06 % NA SOLN: NASAL | Status: AC

## 2013-05-23 NOTE — Telephone Encounter (Signed)
Renewal already taking care of see refill on 05/12/13...Raechel Chute

## 2013-06-03 ENCOUNTER — Other Ambulatory Visit: Payer: Self-pay | Admitting: Internal Medicine

## 2013-06-05 ENCOUNTER — Encounter: Payer: Self-pay | Admitting: Internal Medicine

## 2013-06-06 ENCOUNTER — Other Ambulatory Visit: Payer: Self-pay | Admitting: *Deleted

## 2013-06-06 MED ORDER — BUDESONIDE-FORMOTEROL FUMARATE 80-4.5 MCG/ACT IN AERO: 2.0000 | INHALATION_SPRAY | Freq: Two times a day (BID) | RESPIRATORY_TRACT | Status: DC

## 2013-06-06 NOTE — Telephone Encounter (Signed)
Sent email needing refill on her symbicort...[name]

## 2013-06-16 ENCOUNTER — Encounter: Payer: Self-pay | Admitting: Internal Medicine

## 2013-06-16 ENCOUNTER — Ambulatory Visit (INDEPENDENT_AMBULATORY_CARE_PROVIDER_SITE_OTHER): Payer: Medicare Other | Admitting: Internal Medicine

## 2013-06-16 ENCOUNTER — Other Ambulatory Visit (INDEPENDENT_AMBULATORY_CARE_PROVIDER_SITE_OTHER): Payer: Medicare Other

## 2013-06-16 VITALS — BP 120/58 | HR 93 | Temp 96.9°F | Ht 63.0 in | Wt 107.8 lb

## 2013-06-16 DIAGNOSIS — R109 Unspecified abdominal pain: Secondary | ICD-10-CM

## 2013-06-16 DIAGNOSIS — R5381 Other malaise: Secondary | ICD-10-CM

## 2013-06-16 DIAGNOSIS — F5104 Psychophysiologic insomnia: Secondary | ICD-10-CM

## 2013-06-16 DIAGNOSIS — Z0000 Encounter for general adult medical examination without abnormal findings: Secondary | ICD-10-CM

## 2013-06-16 DIAGNOSIS — J449 Chronic obstructive pulmonary disease, unspecified: Secondary | ICD-10-CM

## 2013-06-16 DIAGNOSIS — R0609 Other forms of dyspnea: Secondary | ICD-10-CM

## 2013-06-16 DIAGNOSIS — R0989 Other specified symptoms and signs involving the circulatory and respiratory systems: Secondary | ICD-10-CM

## 2013-06-16 DIAGNOSIS — R5383 Other fatigue: Secondary | ICD-10-CM

## 2013-06-16 DIAGNOSIS — R748 Abnormal levels of other serum enzymes: Secondary | ICD-10-CM

## 2013-06-16 DIAGNOSIS — G4700 Insomnia, unspecified: Secondary | ICD-10-CM

## 2013-06-16 LAB — CBC WITH DIFFERENTIAL/PLATELET
BASOS ABS: 0.1 10*3/uL (ref 0.0–0.1)
Basophils Relative: 0.8 % (ref 0.0–3.0)
Eosinophils Absolute: 0.1 10*3/uL (ref 0.0–0.7)
Eosinophils Relative: 0.6 % (ref 0.0–5.0)
HCT: 33.7 % — ABNORMAL LOW (ref 36.0–46.0)
Hemoglobin: 11.1 g/dL — ABNORMAL LOW (ref 12.0–15.0)
Lymphocytes Relative: 9.7 % — ABNORMAL LOW (ref 12.0–46.0)
Lymphs Abs: 0.9 10*3/uL (ref 0.7–4.0)
MCHC: 33 g/dL (ref 30.0–36.0)
MCV: 89.1 fl (ref 78.0–100.0)
Monocytes Absolute: 0.9 10*3/uL (ref 0.1–1.0)
Monocytes Relative: 9.9 % (ref 3.0–12.0)
NEUTROS ABS: 7.1 10*3/uL (ref 1.4–7.7)
Neutrophils Relative %: 79 % — ABNORMAL HIGH (ref 43.0–77.0)
Platelets: 350 10*3/uL (ref 150.0–400.0)
RBC: 3.78 Mil/uL — ABNORMAL LOW (ref 3.87–5.11)
RDW: 15.7 % — AB (ref 11.5–14.6)
WBC: 8.9 10*3/uL (ref 4.5–10.5)

## 2013-06-16 LAB — HEPATIC FUNCTION PANEL
ALT: 67 U/L — AB (ref 0–35)
AST: 66 U/L — ABNORMAL HIGH (ref 0–37)
Albumin: 2.8 g/dL — ABNORMAL LOW (ref 3.5–5.2)
Alkaline Phosphatase: 135 U/L — ABNORMAL HIGH (ref 39–117)
BILIRUBIN TOTAL: 0.9 mg/dL (ref 0.3–1.2)
Bilirubin, Direct: 0.2 mg/dL (ref 0.0–0.3)
Total Protein: 7.2 g/dL (ref 6.0–8.3)

## 2013-06-16 LAB — BASIC METABOLIC PANEL
BUN: 26 mg/dL — ABNORMAL HIGH (ref 6–23)
CO2: 25 meq/L (ref 19–32)
Calcium: 9.1 mg/dL (ref 8.4–10.5)
Chloride: 98 mEq/L (ref 96–112)
Creatinine, Ser: 1 mg/dL (ref 0.4–1.2)
GFR: 57.99 mL/min — AB (ref >60.00)
GLUCOSE: 102 mg/dL — AB (ref 70–99)
POTASSIUM: 4.7 meq/L (ref 3.5–5.1)
SODIUM: 132 meq/L — AB (ref 135–145)

## 2013-06-16 LAB — TSH: TSH: 1.86 u[IU]/mL (ref 0.35–5.50)

## 2013-06-16 MED ORDER — ZOLPIDEM TARTRATE 10 MG PO TABS: 10.0000 mg | ORAL_TABLET | Freq: Every evening | ORAL | Status: AC | PRN

## 2013-06-16 NOTE — Assessment & Plan Note (Signed)
Advised on prescription therapies for insomnia - no good options Prescription for her 90 Ambien tablets in 2015 provided to the mail order pharmacy  Consider alternate prescription therapy if no relief with over-the-counter meds

## 2013-06-16 NOTE — Progress Notes (Signed)
Subjective:    Patient ID: Janet Allison, female    DOB: 17-Aug-1925, 80 y.o.   MRN: [id number]  HPI  Here for medicare wellness/physical  Diet: heart healthy Physical activity: active Depression/mood screen: negative Hearing: intact to whispered voice Visual acuity: grossly normal, performs annual eye exam  ADLs: capable Fall risk: none Home safety: good Cognitive evaluation: intact to orientation, naming, recall and repetition EOL planning: adv directives, full code/ I agree  I have personally reviewed and have noted 1. The patient's medical and social history 2. Their use of alcohol, tobacco or illicit drugs 3. Their current medications and supplements 4. The patient's functional ability including ADL's, fall risks, home safety risks and hearing or visual impairment. 5. Diet and physical activities 6. Evidence for depression or mood disorders   Also reviewed chronic medical issues today:  COPD -mild to moderate disease per PFTs October 2013. On medications for same, reports 100% compliance. Follows with pulmonary q6 months and as needed. No recent flares or exacerbations symptoms but chronic dyspnea on exertion without change  hypertension - the patient reports compliance with medication(s) as prescribed. Denies adverse side effects.  Dyslipidemia -on simvastatin -the patient reports compliance with medication(s) as prescribed. Denies adverse side effects.  Coronary artery disease, medical management ongoing -follows with cardiology annually and as needed -no anginal symptoms  OAB - manifest with nocturia and frequency -no dysuria, fever or hematuria - improved with Myrbetiq  Chronic insomnia, uses Ambien as needed but insurance now limiting to 90 tablets per year -? Other alternatives, no prior prescription medication used  Also continued unintended weight loss -related to poor appetite and decreased intake -denies nausea, abdominal pain, bowel changes. no palpitations,  night sweats or medication changes   Past Medical History  Diagnosis Date  . COPD (chronic obstructive pulmonary disease)     mild-mod on PFTs 03/2012  . GERD (gastroesophageal reflux disease)   . Hypertension   . TIA (transient ischemic attack)   . Vertigo, intermittent   . Hyperlipidemia   . Osteoarthrosis, unspecified whether generalized or localized, unspecified site   . CAD (coronary artery disease)     cath 06/2004 single-vessel cad w/occlusion of right current artery, mild to moderate mitral regurgitation w/preserved left ventricular systolic function Dr. Cathie Olden ? ventricular tachycardia when she was 42  . Breast cancer   . Cerebrovascular disease   . Aortic stenosis   . Mitral regurgitation   . Ventricular tachycardia    Family History  Problem Relation Age of Onset  . Heart disease Mother     CAD  . Lung disease Mother   . Other Father     Cerebral Hemorrhage   History  Substance Use Topics  . Smoking status: Former Smoker -- 2.00 packs/day for 35 years    Types: Cigarettes    Quit date: 06/03/1983  . Smokeless tobacco: Never Used  . Alcohol Use: 1.2 oz/week    2 Glasses of wine per week    Review of Systems  Constitutional: Positive for appetite change (chronically no taste) and fatigue (with or after exertion). Negative for fever and unexpected weight change.  Respiratory: Positive for shortness of breath (chronic DOE). Negative for cough, chest tightness and wheezing.   Cardiovascular: Negative for chest pain, palpitations and leg swelling.  Gastrointestinal: Positive for abdominal pain (x 6 day, worse with pressure (dog steeping on belly)). Negative for nausea, vomiting, diarrhea, constipation, blood in stool and abdominal distention.  Genitourinary: Positive for urgency. Negative for decreased  urine volume.  Neurological: Negative for dizziness, weakness, light-headedness and headaches.  Psychiatric/Behavioral: Positive for sleep disturbance (chronic).  Negative for dysphoric mood. The patient is not nervous/anxious.   All other systems reviewed and are negative.       Objective:   Physical Exam BP 120/58  Pulse 93  Temp(Src) 96.9 F (36.1 C) (Oral)  Ht 5\' 3"  (1.6 m)  Wt 107 lb 12.8 oz (48.898 kg)  BMI 19.10 kg/m2  SpO2 96% Wt Readings from Last 3 Encounters:  06/16/13 107 lb 12.8 oz (48.898 kg)  03/04/13 115 lb (52.164 kg)  02/25/13 114 lb 12.8 oz (52.073 kg)   Constitutional: She is thin and fit, appears well-developed and well-nourished. No distress. Dtr at side Neck: Normal range of motion. Neck supple. No JVD present. No thyromegaly present.  Cardiovascular: Normal rate, regular rhythm and normal heart sounds.  No murmur heard. No BLE edema. Pulmonary/Chest: Effort normal and breath sounds normal. No respiratory distress. She has no wheezes.  Abdominal: Soft. Bowel sounds are normal. She exhibits no distension. There is no tenderness. no masses MSkel:no gross deformity or soft tissue swelling Skin: Skin is warm and dry. No rash noted. No erythema.  Psychiatric: She has a normal mood and affect. Her behavior is normal. Judgment and thought content normal.   Lab Results  Component Value Date   WBC 7.1 02/25/2013   HGB 14.2 02/25/2013   HCT 42.5 02/25/2013   PLT 278.0 02/25/2013   GLUCOSE 94 02/25/2013   CHOL 188 02/25/2013   TRIG 87.0 02/25/2013   HDL 84.60 02/25/2013   LDLCALC 86 02/25/2013   ALT 38* 02/25/2013   AST 30 02/25/2013   NA 137 02/25/2013   K 5.3* 02/25/2013   CL 99 02/25/2013   CREATININE 0.9 02/25/2013   BUN 21 02/25/2013   CO2 29 02/25/2013   TSH 1.67 02/25/2013   INR 1.1 07/10/2008       Assessment & Plan:   AWV/CPX/v70.0 - Today patient counseled on age appropriate routine health concerns for screening and prevention, each reviewed and up to date or declined. Immunizations reviewed and up to date or declined. Prior labs/ECG reviewed. Risk factors for depression reviewed and negative. Hearing function and  visual acuity are intact. ADLs screened and addressed as needed. Functional ability and level of safety reviewed and appropriate. Education, counseling and referrals performed based on assessed risks today. Patient provided with a copy of personalized plan for preventive services.  See problem list. Medications and labs reviewed today.  Weight loss, unintentional - suspect related to poor intake - Check screening labs now consider other labs or imaging as needed if continued weight loss trend

## 2013-06-16 NOTE — Progress Notes (Signed)
Pre-visit discussion using our clinic review tool. No additional management support is needed unless otherwise documented below in the visit note.  

## 2013-06-16 NOTE — Assessment & Plan Note (Signed)
Prior pulm eval 2013 reviewed : COPD Pulmonary function test October 2013 with moderate obstruction, decreased diffusion capacity, no restrictive disease Chronic and progressive daily symptoms -will refer for new PFTs now Intermittent compliance with Symbicort due to cost -encourage resumed same and use daily at this time If progressive disease on PFTs, refer for new pulmonary evaluation ( patient requests a different provider if needed) Chronic component of diastolic dysfunction on echo as reviewed October 2014, but euvolemic and compensated, not felt to be cardiac related per recent cardiology review

## 2013-06-16 NOTE — Patient Instructions (Addendum)
It was good to see you today.  We have reviewed your prior records including labs and tests today  Health Maintenance reviewed - all recommended immunizations and age-appropriate screenings are up-to-date.  Test(s) ordered today. Your results will be released to [place] (or called to you) after review, usually within 72hours after test completion. If any changes need to be made, you will be notified at that same time.  Medications reviewed and updated, no changes recommended at this time. Use Symbicort every day! Refill on medication(s) as discussed today.  we'll make referral for Pulmonary function testing to evaluate for progressive lung disease. Our office will contact you regarding appointment(s) once made. You will then be contacted regarding results after review and arrange new pulmonology appointment if needed  Please schedule followup in 6 months for review, call sooner if problems.  Health Maintenance, Female A healthy lifestyle and preventative care can promote health and wellness.  Maintain regular health, dental, and eye exams.  Eat a healthy diet. Foods like vegetables, fruits, whole grains, low-fat dairy products, and lean protein foods contain the nutrients you need without too many calories. Decrease your intake of foods high in solid fats, added sugars, and salt. Get information about a proper diet from your caregiver, if necessary.  Regular physical exercise is one of the most important things you can do for your health. Most adults should get at least 150 minutes of moderate-intensity exercise (any activity that increases your heart rate and causes you to sweat) each week. In addition, most adults need muscle-strengthening exercises on 2 or more days a week.   Maintain a healthy weight. The body mass index (BMI) is a screening tool to identify possible weight problems. It provides an estimate of body fat based on height and weight. Your caregiver can help determine your  BMI, and can help you achieve or maintain a healthy weight. For adults 20 years and older:  A BMI below 18.5 is considered underweight.  A BMI of 18.5 to 24.9 is normal.  A BMI of 25 to 29.9 is considered overweight.  A BMI of 30 and above is considered obese.  Maintain normal blood lipids and cholesterol by exercising and minimizing your intake of saturated fat. Eat a balanced diet with plenty of fruits and vegetables. Blood tests for lipids and cholesterol should begin at age 54 and be repeated every 5 years. If your lipid or cholesterol levels are high, you are over 50, or you are a high risk for heart disease, you may need your cholesterol levels checked more frequently.Ongoing high lipid and cholesterol levels should be treated with medicines if diet and exercise are not effective.  If you smoke, find out from your caregiver how to quit. If you do not use tobacco, do not start.  Lung cancer screening is recommended for adults aged 6 80 years who are at high risk for developing lung cancer because of a history of smoking. Yearly low-dose computed tomography (CT) is recommended for people who have at least a 30-pack-year history of smoking and are a current smoker or have quit within the past 15 years. A pack year of smoking is smoking an average of 1 pack of cigarettes a day for 1 year (for example: 1 pack a day for 30 years or 2 packs a day for 15 years). Yearly screening should continue until the smoker has stopped smoking for at least 15 years. Yearly screening should also be stopped for people who develop a health problem that  would prevent them from having lung cancer treatment.  If you are pregnant, do not drink alcohol. If you are breastfeeding, be very cautious about drinking alcohol. If you are not pregnant and choose to drink alcohol, do not exceed 1 drink per day. One drink is considered to be 12 ounces (355 mL) of beer, 5 ounces (148 mL) of wine, or 1.5 ounces (44 mL) of  liquor.  Avoid use of street drugs. Do not share needles with anyone. Ask for help if you need support or instructions about stopping the use of drugs.  High blood pressure causes heart disease and increases the risk of stroke. Blood pressure should be checked at least every 1 to 2 years. Ongoing high blood pressure should be treated with medicines, if weight loss and exercise are not effective.  If you are 34 to 78 years old, ask your caregiver if you should take aspirin to prevent strokes.  Diabetes screening involves taking a blood sample to check your fasting blood sugar level. This should be done once every 3 years, after age 66, if you are within normal weight and without risk factors for diabetes. Testing should be considered at a younger age or be carried out more frequently if you are overweight and have at least 1 risk factor for diabetes.  Breast cancer screening is essential preventative care for women. You should practice "breast self-awareness." This means understanding the normal appearance and feel of your breasts and may include breast self-examination. Any changes detected, no matter how small, should be reported to a caregiver. Women in their 45s and 30s should have a clinical breast exam (CBE) by a caregiver as part of a regular health exam every 1 to 3 years. After age 3, women should have a CBE every year. Starting at age 72, women should consider having a mammogram (breast X-ray) every year. Women who have a family history of breast cancer should talk to their caregiver about genetic screening. Women at a high risk of breast cancer should talk to their caregiver about having an MRI and a mammogram every year.  Breast cancer gene (BRCA)-related cancer risk assessment is recommended for women who have family members with BRCA-related cancers. BRCA-related cancers include breast, ovarian, tubal, and peritoneal cancers. Having family members with these cancers may be associated with  an increased risk for harmful changes (mutations) in the breast cancer genes BRCA1 and BRCA2. Results of the assessment will determine the need for genetic counseling and BRCA1 and BRCA2 testing.  The Pap test is a screening test for cervical cancer. Women should have a Pap test starting at age 82. Between ages 29 and 78, Pap tests should be repeated every 2 years. Beginning at age 56, you should have a Pap test every 3 years as long as the past 3 Pap tests have been normal. If you had a hysterectomy for a problem that was not cancer or a condition that could lead to cancer, then you no longer need Pap tests. If you are between ages 20 and 10, and you have had normal Pap tests going back 10 years, you no longer need Pap tests. If you have had past treatment for cervical cancer or a condition that could lead to cancer, you need Pap tests and screening for cancer for at least 20 years after your treatment. If Pap tests have been discontinued, risk factors (such as a new sexual partner) need to be reassessed to determine if screening should be resumed. Some women have  medical problems that increase the chance of getting cervical cancer. In these cases, your caregiver may recommend more frequent screening and Pap tests.  The human papillomavirus (HPV) test is an additional test that may be used for cervical cancer screening. The HPV test looks for the virus that can cause the cell changes on the cervix. The cells collected during the Pap test can be tested for HPV. The HPV test could be used to screen women aged 47 years and older, and should be used in women of any age who have unclear Pap test results. After the age of 108, women should have HPV testing at the same frequency as a Pap test.  Colorectal cancer can be detected and often prevented. Most routine colorectal cancer screening begins at the age of 57 and continues through age 33. However, your caregiver may recommend screening at an earlier age if you  have risk factors for colon cancer. On a yearly basis, your caregiver may provide home test kits to check for hidden blood in the stool. Use of a small camera at the end of a tube, to directly examine the colon (sigmoidoscopy or colonoscopy), can detect the earliest forms of colorectal cancer. Talk to your caregiver about this at age 68, when routine screening begins. Direct examination of the colon should be repeated every 5 to 10 years through age 87, unless early forms of pre-cancerous polyps or small growths are found.  Hepatitis C blood testing is recommended for all people born from [birth year] through 1963 and any individual with known risks for hepatitis C.  Practice safe sex. Use condoms and avoid high-risk sexual practices to reduce the spread of sexually transmitted infections (STIs). Sexually active women aged 78 and younger should be checked for Chlamydia, which is a common sexually transmitted infection. Older women with new or multiple partners should also be tested for Chlamydia. Testing for other STIs is recommended if you are sexually active and at increased risk.  Osteoporosis is a disease in which the bones lose minerals and strength with aging. This can result in serious bone fractures. The risk of osteoporosis can be identified using a bone density scan. Women ages 55 and over and women at risk for fractures or osteoporosis should discuss screening with their caregivers. Ask your caregiver whether you should be taking a calcium supplement or vitamin D to reduce the rate of osteoporosis.  Menopause can be associated with physical symptoms and risks. Hormone replacement therapy is available to decrease symptoms and risks. You should talk to your caregiver about whether hormone replacement therapy is right for you.  Use sunscreen. Apply sunscreen liberally and repeatedly throughout the day. You should seek shade when your shadow is shorter than you. Protect yourself by wearing long sleeves,  pants, a wide-brimmed hat, and sunglasses year round, whenever you are outdoors.  Notify your caregiver of new moles or changes in moles, especially if there is a change in shape or color. Also notify your caregiver if a mole is larger than the size of a pencil eraser.  Stay current with your immunizations. Document Released: 12/02/2010 Document Revised: 09/13/2012 Document Reviewed: 12/02/2010 [facility] Patient Information 2014 [place]. Chronic Obstructive Pulmonary Disease Chronic obstructive pulmonary disease (COPD) is a common lung condition in which airflow from the lungs is limited. COPD is a general term that can be used to describe many different lung problems that limit airflow, including both chronic bronchitis and emphysema. If you have COPD, your lung function will probably never  return to normal, but there are measures you can take to improve lung function and make yourself feel better.  CAUSES   Smoking (common).   Exposure to secondhand smoke.   Genetic problems.  Chronic inflammatory lung diseases or recurrent infections. SYMPTOMS   Shortness of breath, especially with physical activity.   Deep, persistent (chronic) cough with a large amount of thick mucus.   Wheezing.   Rapid breaths (tachypnea).   Gray or bluish discoloration (cyanosis) of the skin, especially in fingers, toes, or lips.   Fatigue.   Weight loss.   Frequent infections or episodes when breathing symptoms become much worse (exacerbations).   Chest tightness. DIAGNOSIS  Your healthcare provider will take a medical history and perform a physical examination to make the initial diagnosis. Additional tests for COPD may include:   Lung (pulmonary) function tests.  Chest X-ray.  CT scan.  Blood tests. TREATMENT  Treatment available to help you feel better when you have COPD include:   Inhaler and nebulizer medicines. These help manage the symptoms of COPD and make your  breathing more comfortable  Supplemental oxygen. Supplemental oxygen is only helpful if you have a low oxygen level in your blood.   Exercise and physical activity. These are beneficial for nearly all people with COPD. Some people may also benefit from a pulmonary rehabilitation program. HOME CARE INSTRUCTIONS   Take all medicines (inhaled or pills) as directed by your health care provider.  Only take over-the-counter or prescription medicines for pain, fever, or discomfort as directed by your health care provider.   Avoid over-the-counter medicines or cough syrups that dry up your airway (such as antihistamines) and slow down the elimination of secretions unless instructed otherwise by your healthcare provider.   If you are a smoker, the most important thing that you can do is stop smoking. Continuing to smoke will cause further lung damage and breathing trouble. Ask your health care provider for help with quitting smoking. He or she can direct you to community resources or hospitals that provide support.  Avoid exposure to irritants such as smoke, chemicals, and fumes that aggravate your breathing.  Use oxygen therapy and pulmonary rehabilitation if directed by your health care provider. If you require home oxygen therapy, ask your healthcare provider whether you should purchase a pulse oximeter to measure your oxygen level at home.   Avoid contact with individuals who have a contagious illness.  Avoid extreme temperature and humidity changes.  Eat healthy foods. Eating smaller, more frequent meals and resting before meals may help you maintain your strength.  Stay active, but balance activity with periods of rest. Exercise and physical activity will help you maintain your ability to do things you want to do.  Preventing infection and hospitalization is very important when you have COPD. Make sure to receive all the vaccines your health care provider recommends, especially the  pneumococcal and influenza vaccines. Ask your healthcare provider whether you need a pneumonia vaccine.  Learn and use relaxation techniques to manage stress.  Learn and use controlled breathing techniques as directed by your health care provider. Controlled breathing techniques include:   Pursed lip breathing. Start by breathing in (inhaling) through your nose for 1 second. Then, purse your lips as if you were going to whistle and breathe out (exhale) through the pursed lips for 2 seconds.   Diaphragmatic breathing. Start by putting one hand on your abdomen just above your waist. Inhale slowly through your nose. The hand on your  abdomen should move out. Then purse your lips and exhale slowly. You should be able to feel the hand on your abdomen moving in as you exhale.   Learn and use controlled coughing to clear mucus from your lungs. Controlled coughing is a series of short, progressive coughs. The steps of controlled coughing are:  1. Lean your head slightly forward.  2. Breathe in deeply using diaphragmatic breathing.  3. Try to hold your breath for 3 seconds.  4. Keep your mouth slightly open while coughing twice.  5. Spit any mucus out into a tissue.  6. Rest and repeat the steps once or twice as needed. SEEK MEDICAL CARE IF:   You are coughing up more mucus than usual.   There is a change in the color or thickness of your mucus.   Your breathing is more labored than usual.   Your breathing is faster than usual.  SEEK IMMEDIATE MEDICAL CARE IF:   You have shortness of breath while you are resting.   You have shortness of breath that prevents you from:  Being able to talk.   Performing your usual physical activities.   You have chest pain lasting longer than 5 minutes.   Your skin color is more cyanotic than usual.  You measure low oxygen saturations for longer than 5 minutes with a pulse oximeter. MAKE SURE YOU:   Understand these  instructions.  Will watch your condition.  Will get help right away if you are not doing well or get worse. Document Released: 02/26/2005 Document Revised: 03/09/2013 Document Reviewed: 01/13/2013 [facility] Patient Information 2014 [place], [place].

## 2013-06-16 NOTE — Addendum Note (Signed)
Addended by: Gwendolyn Grant A on: 06/16/2013 01:31 PM   Modules accepted: Orders

## 2013-06-16 NOTE — Assessment & Plan Note (Signed)
nonspecific symptoms/exam Suspect pulmonary rather than cardiac or other medical abnormality check screening labs

## 2013-06-21 ENCOUNTER — Encounter: Payer: Self-pay | Admitting: Internal Medicine

## 2013-06-22 ENCOUNTER — Ambulatory Visit (INDEPENDENT_AMBULATORY_CARE_PROVIDER_SITE_OTHER): Payer: Medicare Other | Admitting: Internal Medicine

## 2013-06-22 ENCOUNTER — Other Ambulatory Visit: Payer: Self-pay | Admitting: Internal Medicine

## 2013-06-22 ENCOUNTER — Encounter: Payer: Self-pay | Admitting: Internal Medicine

## 2013-06-22 VITALS — BP 120/70 | Temp 97.7°F | Wt 105.0 lb

## 2013-06-22 DIAGNOSIS — R0609 Other forms of dyspnea: Secondary | ICD-10-CM

## 2013-06-22 DIAGNOSIS — R0989 Other specified symptoms and signs involving the circulatory and respiratory systems: Secondary | ICD-10-CM

## 2013-06-22 DIAGNOSIS — J449 Chronic obstructive pulmonary disease, unspecified: Secondary | ICD-10-CM

## 2013-06-22 NOTE — Patient Instructions (Signed)
Thanks for coming in.  FOR INCREASED WORK OF BREATHING OR SERIOUS FATIGUE OF BREATHING, CHEST PAIN OR UNRELIEVED WHEEZING GO TO ED!!!  You do not appear to have any infection. You do not have any wheezing at this time. The concern is for diastolic heart failure in a setting of known diastolic dysfunction grade II.   Plan Use your symbicort as directed  Use ProAir 2 puffs every 4 hours as needed for wheezing. If you need to use the ProAir more than 3 times a day seek help  Increase Lasix to 40 mg now, 40 mg at 10 PM, 40 mg in early AM  Return tomorrow for lab  And chest x-ray - orders in - come 1 hour before your appointment  You will be seen tomorrow by Dr. Asa Lente or myself.

## 2013-06-22 NOTE — Progress Notes (Signed)
Pre visit review using our clinic review tool, if applicable. No additional management support is needed unless otherwise documented below in the visit note. 

## 2013-06-23 ENCOUNTER — Ambulatory Visit (INDEPENDENT_AMBULATORY_CARE_PROVIDER_SITE_OTHER)
Admission: RE | Admit: 2013-06-23 | Discharge: 2013-06-23 | Disposition: A | Discharge status: Home / Self Care | Payer: Medicare Other | Source: Ambulatory Visit | Attending: Internal Medicine | Admitting: Internal Medicine

## 2013-06-23 ENCOUNTER — Encounter: Payer: Self-pay | Admitting: Internal Medicine

## 2013-06-23 ENCOUNTER — Telehealth: Payer: Self-pay

## 2013-06-23 ENCOUNTER — Ambulatory Visit (INDEPENDENT_AMBULATORY_CARE_PROVIDER_SITE_OTHER): Payer: Medicare Other | Admitting: Internal Medicine

## 2013-06-23 ENCOUNTER — Other Ambulatory Visit (INDEPENDENT_AMBULATORY_CARE_PROVIDER_SITE_OTHER): Payer: Medicare Other

## 2013-06-23 VITALS — BP 90/62 | HR 87 | Wt 103.8 lb

## 2013-06-23 DIAGNOSIS — J449 Chronic obstructive pulmonary disease, unspecified: Secondary | ICD-10-CM

## 2013-06-23 DIAGNOSIS — R0609 Other forms of dyspnea: Secondary | ICD-10-CM

## 2013-06-23 DIAGNOSIS — I959 Hypotension, unspecified: Secondary | ICD-10-CM

## 2013-06-23 DIAGNOSIS — R0600 Dyspnea, unspecified: Secondary | ICD-10-CM

## 2013-06-23 DIAGNOSIS — R0989 Other specified symptoms and signs involving the circulatory and respiratory systems: Principal | ICD-10-CM

## 2013-06-23 LAB — BASIC METABOLIC PANEL
BUN: 29 mg/dL — ABNORMAL HIGH (ref 6–23)
CALCIUM: 8.8 mg/dL (ref 8.4–10.5)
CO2: 22 meq/L (ref 19–32)
CREATININE: 1.4 mg/dL — AB (ref 0.4–1.2)
Chloride: 96 mEq/L (ref 96–112)
GFR: 37.97 mL/min — ABNORMAL LOW (ref >60.00)
Glucose, Bld: 105 mg/dL — ABNORMAL HIGH (ref 70–99)
Potassium: 4.1 mEq/L (ref 3.5–5.1)
SODIUM: 131 meq/L — AB (ref 135–145)

## 2013-06-23 LAB — CBC WITH DIFFERENTIAL/PLATELET
Basophils Absolute: 0 10*3/uL (ref 0.0–0.1)
Basophils Relative: 0.3 % (ref 0.0–3.0)
EOS ABS: 0 10*3/uL (ref 0.0–0.7)
EOS PCT: 0.1 % (ref 0.0–5.0)
HCT: 33.8 % — ABNORMAL LOW (ref 36.0–46.0)
HEMOGLOBIN: 11 g/dL — AB (ref 12.0–15.0)
LYMPHS PCT: 10.6 % — AB (ref 12.0–46.0)
Lymphs Abs: 1 10*3/uL (ref 0.7–4.0)
MCHC: 32.5 g/dL (ref 30.0–36.0)
MCV: 88.2 fl (ref 78.0–100.0)
Monocytes Absolute: 0.9 10*3/uL (ref 0.1–1.0)
Monocytes Relative: 9.8 % (ref 3.0–12.0)
NEUTROS ABS: 7.3 10*3/uL (ref 1.4–7.7)
NEUTROS PCT: 79.2 % — AB (ref 43.0–77.0)
Platelets: 377 10*3/uL (ref 150.0–400.0)
RBC: 3.83 Mil/uL — ABNORMAL LOW (ref 3.87–5.11)
RDW: 15.8 % — ABNORMAL HIGH (ref 11.5–14.6)
WBC: 9.2 10*3/uL (ref 4.5–10.5)

## 2013-06-23 LAB — BRAIN NATRIURETIC PEPTIDE: PRO B NATRI PEPTIDE: 381 pg/mL — AB (ref 0.0–100.0)

## 2013-06-23 NOTE — Telephone Encounter (Signed)
Patient has an appt 06/23/13 at 11:30

## 2013-06-23 NOTE — Progress Notes (Signed)
Pre visit review using our clinic review tool, if applicable. No additional management support is needed unless otherwise documented below in the visit note. 

## 2013-06-23 NOTE — Assessment & Plan Note (Signed)
Mrs. Vanloan presents with 1 week h/o increased SOB/DOE without evidence of AECOPD. She denies c/p. Chart review indicates progressive diastolic dysfunction. Concern for progressive diastolic heart failure. She declines in-patient eval.  Plan Continue all current medications  Diuresis - take furosemide 40 mg now, 2200 hrs, 0600 hrs.  Return 06/23/13 for 2 view CXR, lab - BNP,BMet, CBCD and follow up office visit

## 2013-06-23 NOTE — Assessment & Plan Note (Signed)
Patient with a diagnosis of COPD. On exam she has no fever. By history she has no purulent sputum. On exam she has increased WOB but no wheezing, no prolonged expiratory phase. Her presentation does not suggestion AECOPD.  Plan Continue symbicort  Continue albuterol rescue as needed. She is carefully instructed to seek medical attention if she needs to use albuterol more than 3 times in 24 hrs.  Return in AM for 2 view CXR

## 2013-06-23 NOTE — Progress Notes (Signed)
Subjective:    Patient ID: Janet Allison, female    DOB: July 30, 1925, 80 y.o.   MRN: [id number]  HPI Mrs. Gherardi presents for increasing SOB. She was seen by Dr. Asa Lente last Thursday.She was seen for a wellness exam but was having some increased SOB in a setting of known COPD is to have PFT's. Of note she has been using Symbicort BID for several weeks.  Since then she is having more coughing, wheezing and marked increase in SOB/DOE. She denies c/p, palpitations, fever. She describes her sputum as creamy white w/o blood. Albuterol MDI has been helpful. Although with increased WOB she denies respiratory fatigue.  Past Medical History  Diagnosis Date  . COPD (chronic obstructive pulmonary disease)     mild-mod on PFTs 03/2012  . GERD (gastroesophageal reflux disease)   . Hypertension   . TIA (transient ischemic attack)   . Vertigo, intermittent   . Hyperlipidemia   . Osteoarthrosis, unspecified whether generalized or localized, unspecified site   . CAD (coronary artery disease)     cath 06/2004 single-vessel cad w/occlusion of right current artery, mild to moderate mitral regurgitation w/preserved left ventricular systolic function Dr. Cathie Olden ? ventricular tachycardia when she was 42  . Breast cancer   . Cerebrovascular disease   . Aortic stenosis   . Mitral regurgitation   . Ventricular tachycardia    Past Surgical History  Procedure Laterality Date  . Cardiac catheterization  06/2004    single-vessel coronary artery disease with occlusion of the right current artery, mild to moderate mitral regurgitation with preserved left ventricular systolic function Dr Acie Fredrickson .  ? Ventricular tachycardia at 42.  . Mastectomy  1967 and 1997  . Joint replacement  2010    total hip  . Tonsillectomy    . Lobectomy  1970    RLL due to massive hemoptysis  . Breast surgery      MASTECTOMY   Family History  Problem Relation Age of Onset  . Heart disease Mother     CAD  . Lung disease Mother   .  Other Father     Cerebral Hemorrhage   History   Social History  . Marital Status: Widowed    Spouse Name: N/A    Number of Children: N/A  . Years of Education: N/A   Occupational History  . retired    Social History Main Topics  . Smoking status: Former Smoker -- 2.00 packs/day for 35 years    Types: Cigarettes    Quit date: 06/03/1983  . Smokeless tobacco: Never Used  . Alcohol Use: 1.2 oz/week    2 Glasses of wine per week  . Drug Use: No  . Sexual Activity: Not Currently    Birth Control/ Protection: Post-menopausal   Other Topics Concern  . Not on file   Social History Narrative   RETIRED   WIDOW   2 SONS 64, 57   1 DAUGHTER 55   FORMER TOBACCO USE   ETOH YES          Current Outpatient Prescriptions on File Prior to Visit  Medication Sig Dispense Refill  . albuterol (PROAIR HFA) 108 (90 BASE) MCG/ACT inhaler Inhale 2 puffs into the lungs every 6 (six) hours as needed for wheezing.      Marland Kitchen albuterol (PROVENTIL HFA;VENTOLIN HFA) 108 (90 BASE) MCG/ACT inhaler Inhale 2 puffs into the lungs every 4 (four) hours as needed. For shortness of breath      . aspirin 81  MG tablet Take 81 mg by mouth daily.        Marland Kitchen co-enzyme Q-10 30 MG capsule Take 30 mg by mouth daily.      . furosemide (LASIX) 20 MG tablet Take one-half tablet by  mouth daily  45 tablet  0  . ipratropium (ATROVENT) 0.06 % nasal spray Use 2 sprays nasally two  times daily  45 mL  0  . losartan (COZAAR) 50 MG tablet Take 1 tablet by mouth 2  times daily.  180 tablet  1  . Magnesium 300 MG CAPS Take by mouth daily.      . Multiple Vitamin (MULTIVITAMIN) tablet Take 1 tablet by mouth daily.      Marland Kitchen MYRBETRIQ 25 MG TB24 tablet TAKE 1 TABLET BY MOUTH DAILY  30 tablet  5  . nebivolol (BYSTOLIC) 10 MG tablet Take 1 tablet (10 mg total) by mouth 2 (two) times daily.  60 tablet  5  . PROAIR HFA 108 (90 BASE) MCG/ACT inhaler USE AS DIRECTED  8.5 g  5  . simvastatin (ZOCOR) 40 MG tablet Take 1 tablet (40 mg total)  by mouth at bedtime.  90 tablet  3  . SYMBICORT 80-4.5 MCG/ACT inhaler INHALE 2 PUFFS INTO THE LUNGS TWICE DAILY. RINSE MOUTH AFTER USE  10.2 g  0  . traMADol-acetaminophen (ULTRACET) 37.5-325 MG per tablet Take 1 tablet by mouth daily as needed. For pain  90 tablet  0  . vitamin E 100 UNIT capsule Take 100 Units by mouth daily.      Marland Kitchen zolpidem (AMBIEN) 10 MG tablet Take 1 tablet (10 mg total) by mouth at bedtime as needed for sleep.  90 tablet  1  . nitroGLYCERIN (NITROSTAT) 0.4 MG SL tablet Place 1 tablet (0.4 mg total) under the tongue every 5 (five) minutes x 3 doses as needed for chest pain.  25 tablet  3   No current facility-administered medications on file prior to visit.    Review of Systems System review is negative for any constitutional, cardiac, pulmonary, GI or neuro symptoms or complaints other than as described in the HPI.     Objective:   Physical Exam Filed Vitals:   06/22/13 1719  BP: 120/70  Temp: 97.7 F (36.5 C)  O2 Sat 94% HR 90  Gen'l- WNWD older woman with increased WOB HEENT - normal Cor - 2+ radial , tachycardic with PVCs, heart sounds distant Pul - increased WOB using some neck accessories, no rales, no wheezing Neuro - awake and alert.       Assessment & Plan:  Offered hospitalization - she declines. She does appear stable enough to return home with very close follow up - see problem list.

## 2013-06-23 NOTE — Telephone Encounter (Signed)
Message copied by Shelly Coss on Thu Jun 23, 2013  8:09 AM ------      Message from: Adella Hare E      Created: Thu Jun 23, 2013  5:51 AM       Add on to schedule 06/23/13 AM for Dr. Asa Lente or [facility].      Patient needs to come in 1 hr prior to appointment time for lab and x-ray. ------

## 2013-06-23 NOTE — Patient Instructions (Signed)
Acute shortness of breath - the BNP was essentially normal, the Chemistry panel was normal except for a mild change in the creatinine (kidney function). The chest x-ray is unchanged from 2013 without any acute changes  Plan Resume your previous home medications  Keep appointment for CT tomorrow  Be very careful - your blood pressure is low due to the increased furosemide doses you have taken - have an arm or support (walker) until you are more steady  Will have you see Dr. Asa Lente next.

## 2013-06-24 ENCOUNTER — Ambulatory Visit (INDEPENDENT_AMBULATORY_CARE_PROVIDER_SITE_OTHER)
Admission: RE | Admit: 2013-06-24 | Discharge: 2013-06-24 | Disposition: A | Discharge status: Home / Self Care | Payer: Medicare Other | Source: Ambulatory Visit | Attending: Internal Medicine | Admitting: Internal Medicine

## 2013-06-24 ENCOUNTER — Telehealth: Payer: Self-pay

## 2013-06-24 DIAGNOSIS — R109 Unspecified abdominal pain: Secondary | ICD-10-CM

## 2013-06-24 DIAGNOSIS — R748 Abnormal levels of other serum enzymes: Secondary | ICD-10-CM

## 2013-06-24 DIAGNOSIS — R918 Other nonspecific abnormal finding of lung field: Secondary | ICD-10-CM

## 2013-06-24 MED ORDER — IOHEXOL 300 MG/ML  SOLN
100.0000 mL | Freq: Once | INTRAMUSCULAR | Status: AC | PRN
  Administered 2013-06-24: 100 mL via INTRAVENOUS

## 2013-06-24 NOTE — Telephone Encounter (Signed)
Dr Linda Hedges and dr Asa Lente both out of office  OK for robin to let pt know, and will refer back to Dr Gwenette Greet who has seen pt sept 2013

## 2013-06-24 NOTE — Telephone Encounter (Signed)
Report reviewed, ct chest significant for left lower lobe mass with effusion - has seen Dr Linda Hedges on jan 20 and 21 - will defer to him

## 2013-06-24 NOTE — Telephone Encounter (Signed)
Received call on physician phone line from [facility] radiology. Report printed and given to Dr. Jenny Reichmann for review.

## 2013-06-24 NOTE — Telephone Encounter (Signed)
Called the patient informed of results and referral per MD instructions.

## 2013-06-25 NOTE — Progress Notes (Signed)
Subjective:    Patient ID: Janet Allison, female    DOB: May 01, 1926, 80 y.o.   MRN: [id number]  HPI Mrs. Vanhook was seen yesterday, 1/21, on an acute basis for marked dyspnea including at rest. She did not have evidence of respiratory infection or AECOPD. She has progressive diastolic dysfunction and based on history and exam she was thought to have diastolic heart failure. Over night she was dosed with furosemide 40 mg approximately q 6 hrs. She returns today for follow up.   She reports that she is feeling very weak and unsteady, corroborated by her daughter. She feels a bit light-headed. She denies any chest pain, cough, wheezing, N/V.  PMH, FamHx and SocHx reviewed for any changes and relevance.  Current Outpatient Prescriptions on File Prior to Visit  Medication Sig Dispense Refill  . albuterol (PROAIR HFA) 108 (90 BASE) MCG/ACT inhaler Inhale 2 puffs into the lungs every 6 (six) hours as needed for wheezing.      Marland Kitchen albuterol (PROVENTIL HFA;VENTOLIN HFA) 108 (90 BASE) MCG/ACT inhaler Inhale 2 puffs into the lungs every 4 (four) hours as needed. For shortness of breath      . aspirin 81 MG tablet Take 81 mg by mouth daily.        Marland Kitchen co-enzyme Q-10 30 MG capsule Take 30 mg by mouth daily.      . furosemide (LASIX) 20 MG tablet Take one-half tablet by  mouth daily  45 tablet  0  . ipratropium (ATROVENT) 0.06 % nasal spray Use 2 sprays nasally two  times daily  45 mL  0  . losartan (COZAAR) 50 MG tablet Take 1 tablet by mouth 2  times daily.  180 tablet  1  . Magnesium 300 MG CAPS Take by mouth daily.      . Multiple Vitamin (MULTIVITAMIN) tablet Take 1 tablet by mouth daily.      Marland Kitchen MYRBETRIQ 25 MG TB24 tablet TAKE 1 TABLET BY MOUTH DAILY  30 tablet  5  . nebivolol (BYSTOLIC) 10 MG tablet Take 1 tablet (10 mg total) by mouth 2 (two) times daily.  60 tablet  5  . PROAIR HFA 108 (90 BASE) MCG/ACT inhaler USE AS DIRECTED  8.5 g  5  . simvastatin (ZOCOR) 40 MG tablet Take 1 tablet (40 mg  total) by mouth at bedtime.  90 tablet  3  . SYMBICORT 80-4.5 MCG/ACT inhaler INHALE 2 PUFFS INTO THE LUNGS TWICE DAILY. RINSE MOUTH AFTER USE  10.2 g  0  . traMADol-acetaminophen (ULTRACET) 37.5-325 MG per tablet Take 1 tablet by mouth daily as needed. For pain  90 tablet  0  . vitamin E 100 UNIT capsule Take 100 Units by mouth daily.      Marland Kitchen zolpidem (AMBIEN) 10 MG tablet Take 1 tablet (10 mg total) by mouth at bedtime as needed for sleep.  90 tablet  1  . nitroGLYCERIN (NITROSTAT) 0.4 MG SL tablet Place 1 tablet (0.4 mg total) under the tongue every 5 (five) minutes x 3 doses as needed for chest pain.  25 tablet  3   No current facility-administered medications on file prior to visit.      Review of Systems System review is negative for any constitutional, cardiac, pulmonary, GI or neuro symptoms or complaints other than as described in the HPI.     Objective:   Physical Exam Filed Vitals:   06/23/13 1122  BP: 90/62  Pulse: 87   O2 sat 98%  Gen'l  Elderly well groomed woman in no acute distress HEENT - C&S clear Cor 2+ radial, RRR PUlm - mild increased WOB but improved from 1/21, no wheezing or rales. Neuro - A&O x 3 and in good spirits.  CHEST 2 VIEW  COMPARISON: 09/05/2011  FINDINGS:  Lungs are adequately inflated with minimal chronic interstitial  changes over the lateral bases. Postsurgical change in the right  hilar region and upper paramediastinal region. No focal airspace  process or consolidation. Cardiomediastinal silhouette and remainder  of the exam is unchanged.  IMPRESSION:  No active cardiopulmonary disease.  Stable chronic bibasilar interstitial markings and postsurgical  change as described.  Bmet - Cr 1.4 (up from 1.0 06/16/13), electrolytes normal CBCD  WBC 9.2, Hgb 11.0, 79% segs, 10 % lymphs, 9.8% monos BNP  381         Assessment & Plan:  Dyspnea  - scant evidence of significant pulmonary edema/CHF. Diuresis has lead to systolic hypotension  and mild rise in Cr Plan Resume previous furosemide dose  Resume normal hydration  Have assistance through the day due to imbalance  See Dr. Asa Lente in follow up early next week.  Addendum: CT abd was done 1/23 - new mass in the chest seen. Attempted to call patient with report 1/23 @ 1730 hrs - no answer.

## 2013-06-26 DIAGNOSIS — R918 Other nonspecific abnormal finding of lung field: Secondary | ICD-10-CM | POA: Insufficient documentation

## 2013-06-27 ENCOUNTER — Encounter: Payer: Self-pay | Admitting: Internal Medicine

## 2013-06-27 ENCOUNTER — Ambulatory Visit (INDEPENDENT_AMBULATORY_CARE_PROVIDER_SITE_OTHER)
Admission: RE | Admit: 2013-06-27 | Discharge: 2013-06-27 | Disposition: A | Discharge status: Home / Self Care | Payer: Medicare Other | Source: Ambulatory Visit | Attending: Internal Medicine | Admitting: Internal Medicine

## 2013-06-27 ENCOUNTER — Ambulatory Visit (INDEPENDENT_AMBULATORY_CARE_PROVIDER_SITE_OTHER): Payer: Medicare Other | Admitting: Internal Medicine

## 2013-06-27 VITALS — BP 100/62 | Wt 102.8 lb

## 2013-06-27 DIAGNOSIS — R222 Localized swelling, mass and lump, trunk: Secondary | ICD-10-CM

## 2013-06-27 DIAGNOSIS — R634 Abnormal weight loss: Secondary | ICD-10-CM

## 2013-06-27 DIAGNOSIS — J449 Chronic obstructive pulmonary disease, unspecified: Secondary | ICD-10-CM

## 2013-06-27 DIAGNOSIS — R918 Other nonspecific abnormal finding of lung field: Secondary | ICD-10-CM

## 2013-06-27 MED ORDER — MORPHINE SULFATE 10 MG/5ML PO SOLN: 2.0000 mg | ORAL | Status: AC | PRN

## 2013-06-27 MED ORDER — IOHEXOL 300 MG/ML  SOLN
60.0000 mL | Freq: Once | INTRAMUSCULAR | Status: AC | PRN
  Administered 2013-06-27: 60 mL via INTRAVENOUS

## 2013-06-27 NOTE — Telephone Encounter (Signed)
Janet Allison - please help pt schedule OV with me in 2 weeks

## 2013-06-27 NOTE — Telephone Encounter (Signed)
[phone] (home)  I spoke in person with MEN this afternoon I then called pt at home and spoke to her and dtr Magda Paganini re: interval hx (OV x 3 with MEN, CT abd and f/u CT lung with LLL mass, suspicious for primary lung ca). Pt feels strongly she does not wish to undergo dx testing to "confirm" lung ca dx, specifically does not feel PET or bx would change her decision to pursue conservative, palliative only care. Given what appears to be metastatic dz on CT, adv age, adv COPD and quickly advancing symptoms (dyspnea, fatigue and weight loss), I support pt's decision as does her dtr Magda Paganini Pt confirms NCB and living will status UTD. Will consult home hospice for advise on symptomatic care Feel prognosis is <67mo -  explained same to pt/dtr who are very appreciative of information and respectful dialogue on her desire for conservative, palliative only care Pt will continue SL MSO4 prn dyspnea OV with me in 2 weeks to review, sooner if symptoms or problems

## 2013-06-27 NOTE — Patient Instructions (Signed)
I regret that there is the possibility of a solid tumor in your left lung.  Plan CT chest today at 2:30, please arrive at [facility] at 2:15  Follow-up PET scan ASAP if the study is positive ` For severe shortness of breath morphine 2mg , 1 ml, every 2 hours as needed. May increase to 4 mg if needed

## 2013-06-27 NOTE — Progress Notes (Signed)
Pre visit review using our clinic review tool, if applicable. No additional management support is needed unless otherwise documented below in the visit note. 

## 2013-06-28 ENCOUNTER — Ambulatory Visit: Payer: Medicare Other | Admitting: Internal Medicine

## 2013-06-28 ENCOUNTER — Telehealth: Payer: Self-pay | Admitting: Internal Medicine

## 2013-06-28 NOTE — Telephone Encounter (Signed)
Called pt no answer LMOM (home) to return call bck and make a 2 week follow-up with Dr. Asa Lente.Marland KitchenJohny Allison

## 2013-06-28 NOTE — Progress Notes (Signed)
   Subjective:    Patient ID: Janet Allison, female    DOB: Apr 05, 1926, 80 y.o.   MRN: [id number]  HPI Janet Allison returns today to discuss incidental finding of large mass in LLL found at time of CT abdomen/pelvis. She has been seen several times for profound DOE/SOB. She received a call from [facility] Friday with report of new lung mass and could not wait until Tuesday to discuss this with Dr. Asa Lente 2/2 overwhelming anxiety about the findings.  She has remained severely short of breath with the least activity and is short of breath at rest.  PMH, FamHx and SocHx reviewed for any changes and relevance.  Meds reviewed - no change since last visit 06/23/13   Review of Systems System review is negative for any constitutional, cardiac, pulmonary, GI or neuro symptoms or complaints other than as described in the HPI.     Objective:   Physical Exam Filed Vitals:   06/27/13 1106  BP: 100/62  O2 sat could not be determined  gen'l- thin, chipper elderly woman with respiratory discomfort -  HEENT- C&S clear Cor - regular tachycardia Pulm - Short of breath with talking. Observed DOE as she walks from the exam room Neuro - bright, alert, oriented.  Reviewed CT abd/pelvis report and images with patient and her daughter.        Assessment & Plan:  Lung mass LLL - new finding. Discussed likelihood of this being a malignant mass although tried to develop alternative diagnosis. Outlined the next steps in evaluation: dedicated CT chest w/ contrast, PET scan to follow along with needle biopsy for tissue diagnosis. Allowed time to answer all of her and her daughter's question. Raised the possibility for not pursuing aggressive treatment given age and co-morbidities.  Plan Urgent CT chest scheduled for today at 14:30 hrs - patient and daughter aware of appointment time  Liquid morphine 10 mg/34ml - to take 2 mg q 2 hours by mouth as needed for relief of dyspnea.  Addendum: IMPRESSION:  Enhancing  masslike opacity in medial left lower lobe measuring 4.3 x  7.2 cm, suspicious for primary bronchogenic carcinoma. Small left  posterior pleural effusion also noted.  Left hilar and mediastinal lymphadenopathy, consistent with  metastatic disease. Probable metastatic lymphadenopathy within the  left upper abdomen posterior to gastric fundus.  Probable small left adrenal metastasis.  Other small bilateral indeterminate pulmonary nodules measuring up  to 9 mm. Moderate emphysema.  Per Dr. Asa Lente, who called report to patient, Janet Allison has elected to not pursue further evaluation. Dr. Asa Lente has made referral to Hospice.

## 2013-06-28 NOTE — Telephone Encounter (Signed)
Relevant patient education assigned to patient using Emmi. ° °

## 2013-06-29 ENCOUNTER — Encounter: Payer: Self-pay | Admitting: Internal Medicine

## 2013-06-29 NOTE — Telephone Encounter (Signed)
Janet Allison [facility]) says on hospice referral that "lucy will do" - ?has this been done? Thanks for checking in on this status

## 2013-07-01 ENCOUNTER — Telehealth: Payer: Self-pay | Admitting: Internal Medicine

## 2013-07-01 NOTE — Telephone Encounter (Signed)
Pam called from [facility] [facility] stated that Pt is requesting DO NOT Resuscitate form and she was wondering if it is [facility] for their doctor to sign? Please advise.

## 2013-07-01 NOTE — Telephone Encounter (Signed)
Yes, ok for their doctor to sign thanks

## 2013-07-04 ENCOUNTER — Institutional Professional Consult (permissible substitution): Payer: Medicare Other | Admitting: Pulmonary Disease

## 2013-07-04 NOTE — Telephone Encounter (Signed)
Notified Pam back was not in office yet. Gave md order to [place]...[name]

## 2013-07-08 ENCOUNTER — Ambulatory Visit (INDEPENDENT_AMBULATORY_CARE_PROVIDER_SITE_OTHER): Payer: Medicare Other | Admitting: Internal Medicine

## 2013-07-08 ENCOUNTER — Encounter: Payer: Self-pay | Admitting: Internal Medicine

## 2013-07-08 VITALS — BP 98/62 | HR 81 | Wt 101.8 lb

## 2013-07-08 DIAGNOSIS — I10 Essential (primary) hypertension: Secondary | ICD-10-CM

## 2013-07-08 DIAGNOSIS — R222 Localized swelling, mass and lump, trunk: Secondary | ICD-10-CM

## 2013-07-08 DIAGNOSIS — J4489 Other specified chronic obstructive pulmonary disease: Secondary | ICD-10-CM

## 2013-07-08 DIAGNOSIS — R918 Other nonspecific abnormal finding of lung field: Secondary | ICD-10-CM

## 2013-07-08 DIAGNOSIS — J449 Chronic obstructive pulmonary disease, unspecified: Secondary | ICD-10-CM

## 2013-07-08 DIAGNOSIS — E785 Hyperlipidemia, unspecified: Secondary | ICD-10-CM

## 2013-07-08 MED ORDER — FUROSEMIDE 20 MG PO TABS: 10.0000 mg | ORAL_TABLET | Freq: Every day | ORAL | Status: AC | PRN

## 2013-07-08 NOTE — Patient Instructions (Signed)
It was good to see you today.  We have reviewed your prior records including labs and tests today  Medications reviewed and updated Stop losartan Stop simvastatin Reduce furosemide to half tablet (10mg ) daily as needed for fluid or swelling  No other changes recommended  Continue working with hospice as ongoing  ALT in 3-6 months, please call sooner if problems or concerns

## 2013-07-08 NOTE — Assessment & Plan Note (Signed)
BP Readings from Last 3 Encounters:  07/08/13 98/62  06/27/13 100/62  06/23/13 90/62   Given fatigue, tendency towards hypotension and terminal prognosis, will discontinue ARB at this time Continue Bystolic as tolerated by blood pressure to avoid tachyarrhythmia Change furosemide to daily as needed for edema or dyspnea on relieved by neb, oxygen or liquid morphine

## 2013-07-08 NOTE — Assessment & Plan Note (Signed)
Lung mass with metastases diagnosed on CT chest January 2015, presumed lung cancer  The patient has elected against pursuing biopsy or aggressive treatment. Following with home hospice since late January 2015 Continued fatigue, dyspnea, anorexia and weight loss reviewed Patient denies pain - She expresses considerable peace and understanding about cause of her symptoms and prognosis -appreciative of care that has been provided Patient is DO NOT RESUSCITATE -no heroics to be pursued Supportive care as ongoing Patient will followup as needed

## 2013-07-08 NOTE — Assessment & Plan Note (Signed)
Discontinue simvastatin given terminal prognosis Patient and family understand and agree to same

## 2013-07-08 NOTE — Progress Notes (Signed)
Pre-visit discussion using our clinic review tool. No additional management support is needed unless otherwise documented below in the visit note.  

## 2013-07-08 NOTE — Progress Notes (Signed)
Subjective:    Patient ID: Janet Allison, female    DOB: 1925-07-29, 80 y.o.   MRN: [id number]  HPI  Patient is here for follow up  Reviewed chronic medical issues and interval medical events -diagnosis of presumed lung cancer with metastases, now enrolled with home hospice  Continued fatigue, anorexia and dyspnea on exertion   Past Medical History  Diagnosis Date  . COPD (chronic obstructive pulmonary disease)     mild-mod on PFTs 03/2012  . GERD (gastroesophageal reflux disease)   . Hypertension   . TIA (transient ischemic attack)   . Vertigo, intermittent   . Hyperlipidemia   . Osteoarthrosis, unspecified whether generalized or localized, unspecified site   . CAD (coronary artery disease)     cath 06/2004 single-vessel cad w/occlusion of right current artery, mild to moderate mitral regurgitation w/preserved left ventricular systolic function Dr. Cathie Olden ? ventricular tachycardia when she was 42  . Breast cancer   . Cerebrovascular disease   . Aortic stenosis   . Mitral regurgitation   . Ventricular tachycardia     Review of Systems  Constitutional: Positive for fatigue. Negative for fever.  Respiratory: Positive for shortness of breath (chronic). Negative for cough.   Cardiovascular: Negative for chest pain and leg swelling.       Objective:   Physical Exam  BP 98/62  Pulse 81  Wt 101 lb 12.8 oz (46.176 kg)  SpO2 97% Wt Readings from Last 3 Encounters:  07/08/13 101 lb 12.8 oz (46.176 kg)  06/27/13 102 lb 12.8 oz (46.63 kg)  06/23/13 103 lb 12.8 oz (47.083 kg)   Constitutional: She is frail, in WC, but no distress. Dtr at side Neck: Normal range of motion. Neck supple. No JVD present. No thyromegaly present.  Cardiovascular: Normal rate, regular rhythm and normal heart sounds.  No murmur heard. No BLE edema. Pulmonary/Chest: slight increased work of breathing with conversational effort. Breath sounds diminished at bases but no wheeze or crackle  Skin: Skin  is warm and dry. No rash noted. No erythema.  Psychiatric: She has a bright, spry -normal mood and affect. Her behavior is normal. Judgment and thought content normal/insightful.   Lab Results  Component Value Date   WBC 9.2 06/23/2013   HGB 11.0* 06/23/2013   HCT 33.8* 06/23/2013   PLT 377.0 06/23/2013   GLUCOSE 105* 06/23/2013   CHOL 188 02/25/2013   TRIG 87.0 02/25/2013   HDL 84.60 02/25/2013   LDLCALC 86 02/25/2013   ALT 67* 06/16/2013   AST 66* 06/16/2013   NA 131* 06/23/2013   K 4.1 06/23/2013   CL 96 06/23/2013   CREATININE 1.4* 06/23/2013   BUN 29* 06/23/2013   CO2 22 06/23/2013   TSH 1.86 06/16/2013   INR 1.1 07/10/2008    Ct Chest W Contrast  06/27/2013   CLINICAL DATA:  Chronic cough. Recent 25 lb weight loss. Left lung mass seen on recent abdomen CT.  EXAM: CT CHEST WITH CONTRAST  TECHNIQUE: Multidetector CT imaging of the chest was performed during intravenous contrast administration.  CONTRAST:  71mL OMNIPAQUE IOHEXOL 300 MG/ML  SOLN  COMPARISON:  Abdomen CT on 06/24/2013  FINDINGS: Heterogeneously enhancing mass like opacity is seen in the medial left lower lobe which abuts the posterior mediastinum. This measures 4.3 x 7.2 cm. A small left pleural effusion is seen.  Mild left hilar lymphadenopathy is seen. Mediastinal lymphadenopathy also seen in the AP window, subcarinal region, precarinal region, and right paratracheal region. Largest area  of mediastinal lymphadenopathy is located in the subcarinal region measuring on image 25. A 11 mm superior mediastinal lymph node is also seen in the prevascular space. This is consistent with metastatic disease.  Moderate pulmonary emphysema is demonstrated. Right upper lobe scarring is demonstrated. A noncalcified pulmonary nodule is seen in the anterior right upper lobe measuring 5 mm on image 16, and a 4 mm subpleural nodule is seen in the right middle lobe on image 29. Another subpleural nodule is seen in the lateral right lower lobe measuring 9 mm on  image 38. A subpleural nodule is also seen along the major fissure in the left lung measuring 6 mm on image 36.  A left adrenal nodule is seen measuring 1.3 x 1.8 cm. Adrenal metastasis cannot be excluded. Soft tissue prominence in the upper abdomen between the abdominal aorta and gastric fundus is again noted which measures approximately 1.7 x 3.7 cm on image 52 and is suspicious for upper abdominal metastatic lymphadenopathy.3 cholelithiasis incidentally noted. No suspicious bone lesions seen within the thorax.1  IMPRESSION: Enhancing masslike opacity in medial left lower lobe measuring 4.3 x 7.2 cm, suspicious for primary bronchogenic carcinoma. Small left posterior pleural effusion also noted.  Left hilar and mediastinal lymphadenopathy, consistent with metastatic disease. Probable metastatic lymphadenopathy within the left upper abdomen posterior to gastric fundus.  Probable small left adrenal metastasis.  Other small bilateral indeterminate pulmonary nodules measuring up to 9 mm. Moderate emphysema.  Cholelithiasis incidentally noted.   Electronically Signed   By: Earle Gell M.D.   On: 06/27/2013 17:04       Assessment & Plan:   Problem List Items Addressed This Visit   COPD   HYPERLIPIDEMIA     Discontinue simvastatin given terminal prognosis Patient and family understand and agree to same    Relevant Medications      furosemide (LASIX) tablet   HYPERTENSION      BP Readings from Last 3 Encounters:  07/08/13 98/62  06/27/13 100/62  06/23/13 90/62   Given fatigue, tendency towards hypotension and terminal prognosis, will discontinue ARB at this time Continue Bystolic as tolerated by blood pressure to avoid tachyarrhythmia Change furosemide to daily as needed for edema or dyspnea on relieved by neb, oxygen or liquid morphine    Lung mass - Primary     Lung mass with metastases diagnosed on CT chest January 2015, presumed lung cancer  The patient has elected against pursuing biopsy or  aggressive treatment. Following with home hospice since late January 2015 Continued fatigue, dyspnea, anorexia and weight loss reviewed Patient denies pain - She expresses considerable peace and understanding about cause of her symptoms and prognosis -appreciative of care that has been provided Patient is DO NOT RESUSCITATE -no heroics to be pursued Supportive care as ongoing Patient will followup as needed

## 2013-07-17 ENCOUNTER — Encounter: Payer: Self-pay | Admitting: Internal Medicine

## 2013-07-17 NOTE — Progress Notes (Signed)
On call    Gerald Stabs from hospice .MS needs to be refilled  and atypical preparation  hard to get concentration for them as written.    Asks if ok to change the morphine  To theirstandard management  20mg  per cc and have hospice doc manage the pain and respiratory . I agreed .  WKP

## 2013-07-20 ENCOUNTER — Telehealth: Payer: Self-pay | Admitting: Internal Medicine

## 2013-07-31 NOTE — Telephone Encounter (Signed)
FYIVara Guardian from [facility] called to let us know that Janet Allison passed away this morning at 11:41.  Rodena Piety in medical records is aware.

## 2013-07-31 NOTE — Telephone Encounter (Signed)
Thanks for the note.

## 2013-07-31 DEATH — deceased

## 2013-12-15 ENCOUNTER — Ambulatory Visit: Payer: Medicare Other | Admitting: Internal Medicine

## 2015-02-05 IMAGING — CT CT CHEST W/ CM
2 of 4 series · 15 of 36 positions shown, 18 images · IV contrast (Omnipaque 300)
Comparison: Abdomen CT on 06/24/2013

CLINICAL DATA: Chronic cough. Recent 25 lb weight loss. Left lung
mass seen on recent abdomen CT.

EXAM:
CT CHEST WITH CONTRAST
TECHNIQUE: Multidetector CT imaging of the chest was performed during
intravenous contrast administration.
CONTRAST:  60mL OMNIPAQUE IOHEXOL 300 MG/ML  SOLN

[Series 2: chest routine with · axial · 0.67mm/px · z∈[-257,-12]mm · 12 of 59 slices shown, 15 images]
[im 5/59  mediastinal]
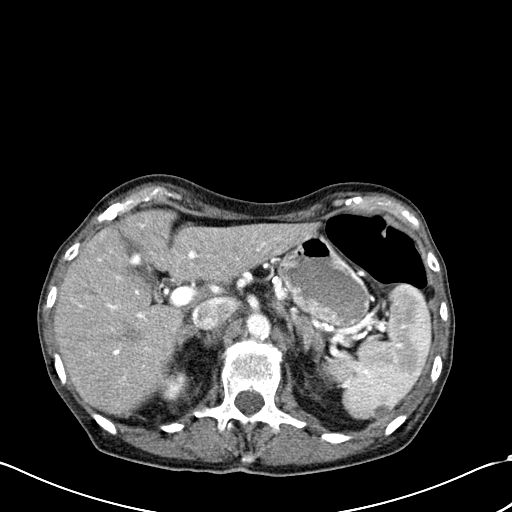
[im 5/59  lung]
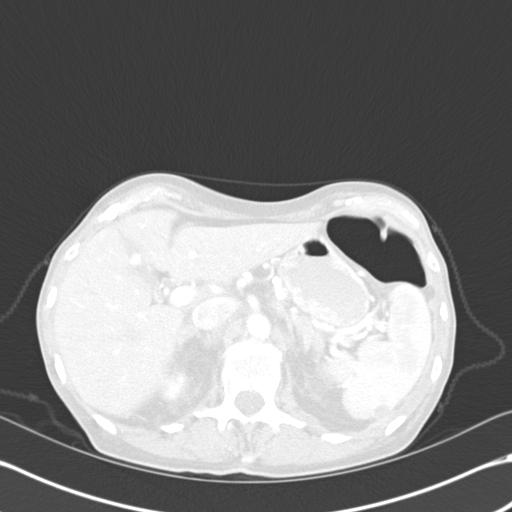
[im 9/59  lung]
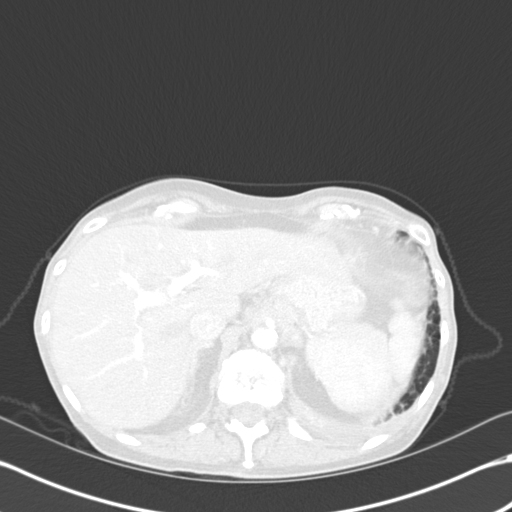
[im 14/59  lung]
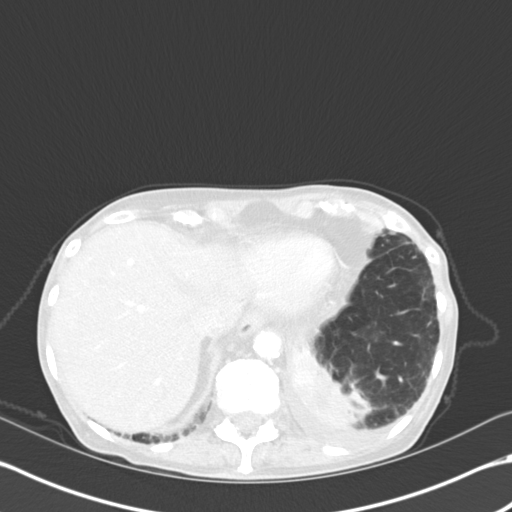
[im 18/59  lung]
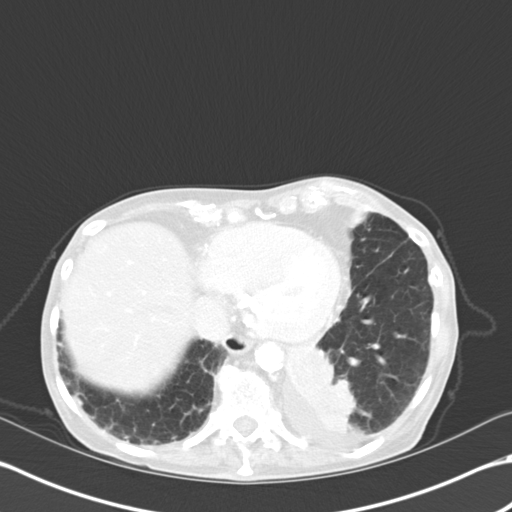
[im 23/59  mediastinal]
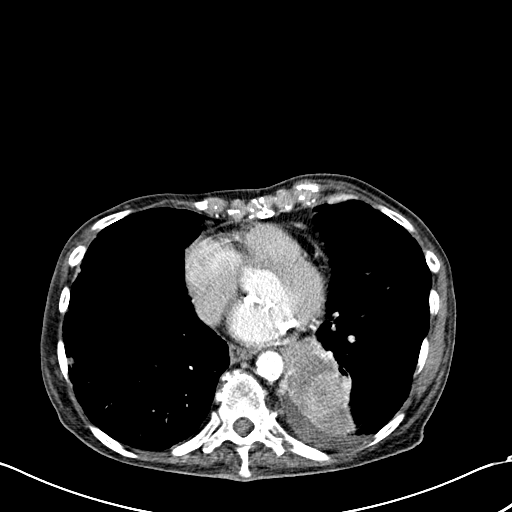
[im 23/59  lung]
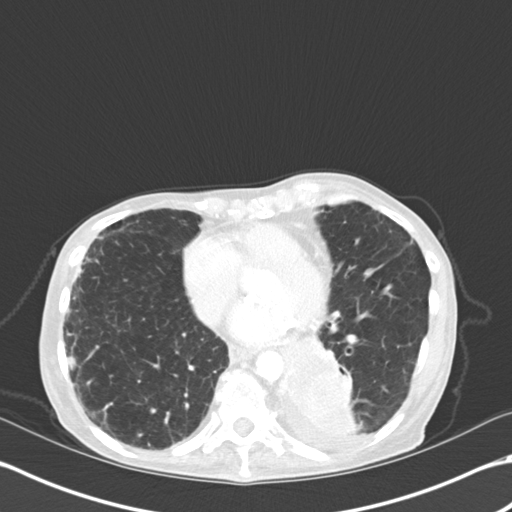
[im 27/59  lung]
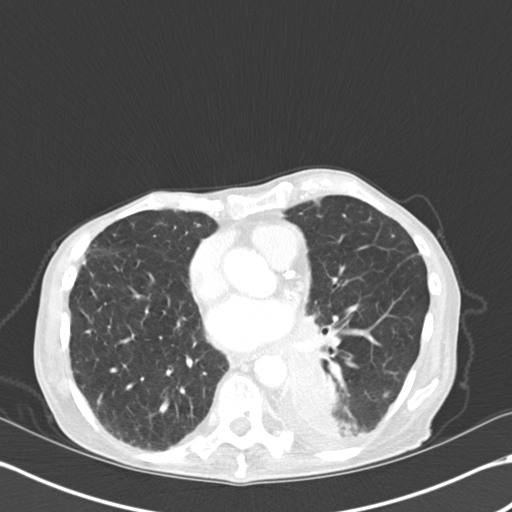
[im 32/59  lung]
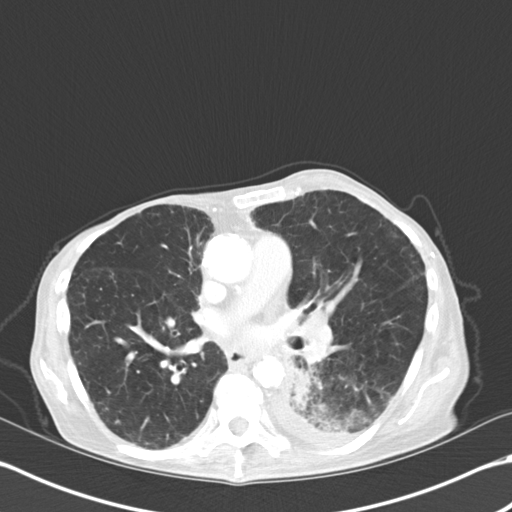
[im 36/59  lung]
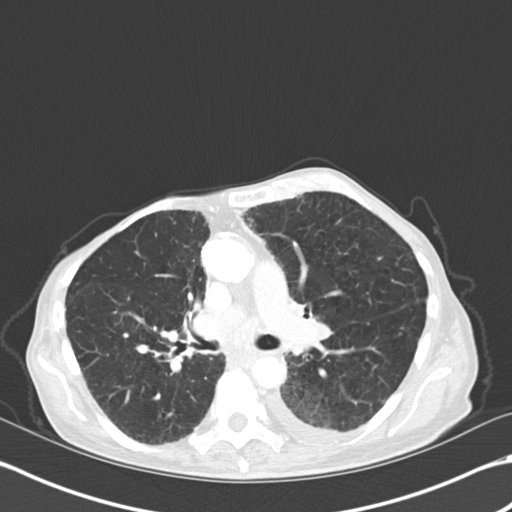
[im 41/59  mediastinal]
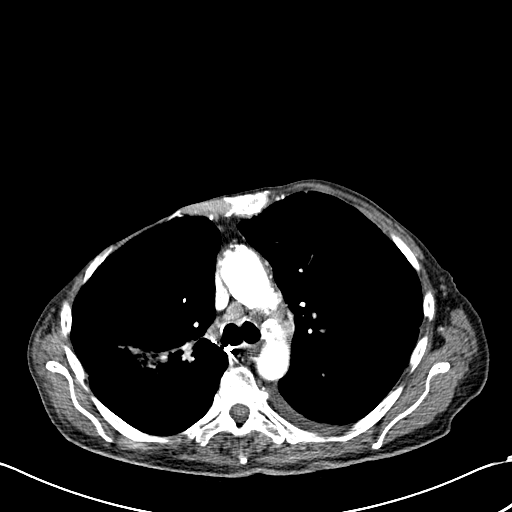
[im 41/59  lung]
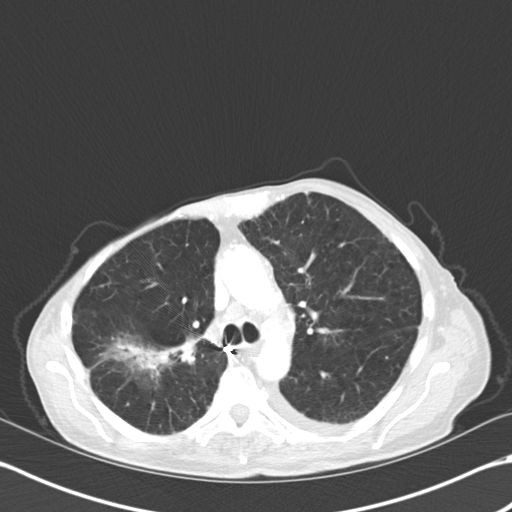
[im 45/59  lung]
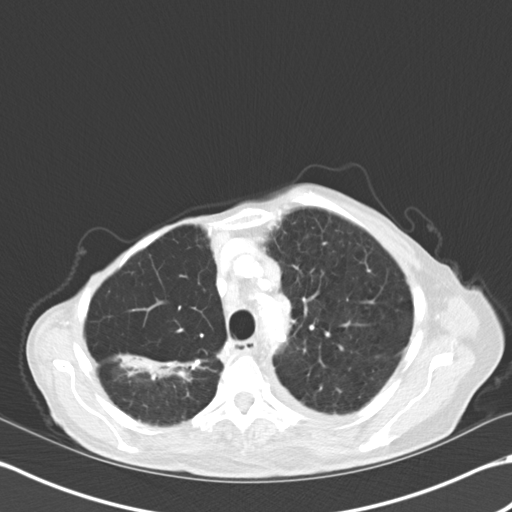
[im 50/59  lung]
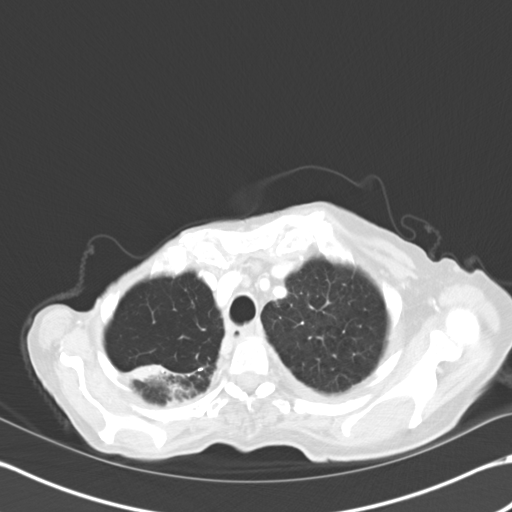
[im 54/59  lung]
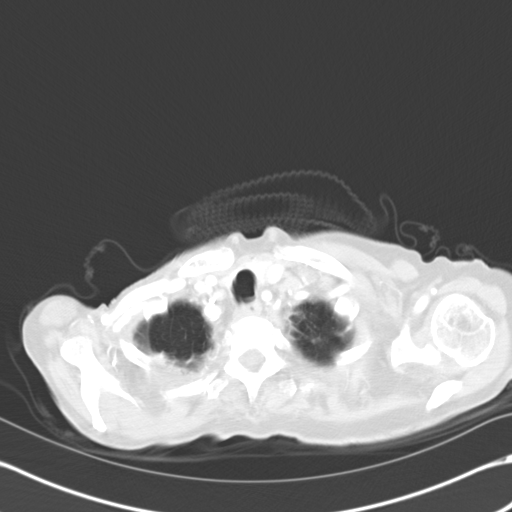

[Series 602: cor · coronal · 0.67mm/px · 3 of 98 slices shown]
[im 20/98  lung]
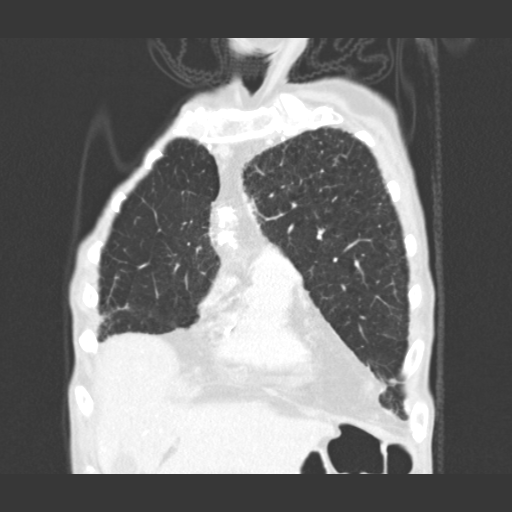
[im 39/98  lung]
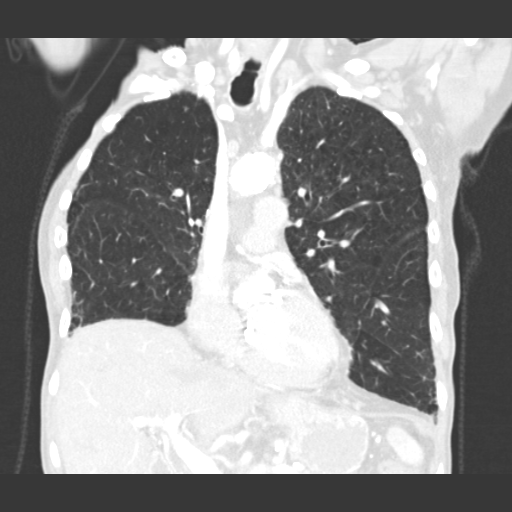
[im 59/98  lung]
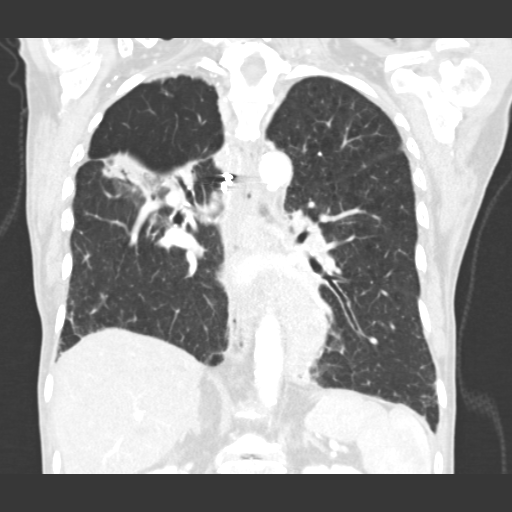

[15 of 36 positions shown; findings below may reference images not displayed]

FINDINGS: Heterogeneously enhancing mass like opacity is seen in the medial
left lower lobe which abuts the posterior mediastinum. This measures
4.3 x 7.2 cm. A small left pleural effusion is seen.

Mild left hilar lymphadenopathy is seen. Mediastinal lymphadenopathy
also seen in the AP window, subcarinal region, precarinal region,
and right paratracheal region. Largest area of mediastinal
lymphadenopathy is located in the subcarinal region measuring on
image 25. A 11 mm superior mediastinal lymph node is also seen in
the prevascular space. This is consistent with metastatic disease.

Moderate pulmonary emphysema is demonstrated. Right upper lobe
scarring is demonstrated. A noncalcified pulmonary nodule is seen in
the anterior right upper lobe measuring 5 mm on image 16, and a 4 mm
subpleural nodule is seen in the right middle lobe on image 29.
Another subpleural nodule is seen in the lateral right lower lobe
measuring 9 mm on image 38. A subpleural nodule is also seen along
the major fissure in the left lung measuring 6 mm on image 36.

A left adrenal nodule is seen measuring 1.3 x 1.8 cm. Adrenal
metastasis cannot be excluded. Soft tissue prominence in the upper
abdomen between the abdominal aorta and gastric fundus is again
noted which measures approximately 1.7 x 3.7 cm on image 52 and is
suspicious for upper abdominal metastatic lymphadenopathy.3
cholelithiasis incidentally noted. No suspicious bone lesions seen
within the thorax.1
IMPRESSION: Enhancing masslike opacity in medial left lower lobe measuring 4.3 x
7.2 cm, suspicious for primary bronchogenic carcinoma. Small left
posterior pleural effusion also noted.

Left hilar and mediastinal lymphadenopathy, consistent with
metastatic disease. Probable metastatic lymphadenopathy within the
left upper abdomen posterior to gastric fundus.

Probable small left adrenal metastasis.

Other small bilateral indeterminate pulmonary nodules measuring up
to 9 mm. Moderate emphysema.

Cholelithiasis incidentally noted.
